# Patient Record
Sex: Male | Born: 1980
Health system: Southern US, Community
[De-identification: ages and names within clinical notes are randomized; demographics above are authoritative.]

## PROBLEM LIST (undated history)

## (undated) DIAGNOSIS — E538 Deficiency of other specified B group vitamins: Secondary | ICD-10-CM

## (undated) DIAGNOSIS — R17 Unspecified jaundice: Secondary | ICD-10-CM

## (undated) HISTORY — PX: OTHER SURGICAL HISTORY: SHX169

## (undated) HISTORY — PX: CONDYLOMA EXCISION/FULGURATION: SHX1389

## (undated) HISTORY — DX: Deficiency of other specified B group vitamins: E53.8

---

## 2010-01-04 DIAGNOSIS — F411 Generalized anxiety disorder: Secondary | ICD-10-CM | POA: Insufficient documentation

## 2010-08-19 LAB — HM HEPATITIS C SCREENING LAB: HM Hepatitis Screen: NEGATIVE

## 2012-10-06 HISTORY — PX: COLONOSCOPY: SHX174

## 2016-09-10 DIAGNOSIS — F4322 Adjustment disorder with anxiety: Secondary | ICD-10-CM | POA: Diagnosis not present

## 2016-09-17 DIAGNOSIS — F4322 Adjustment disorder with anxiety: Secondary | ICD-10-CM | POA: Diagnosis not present

## 2016-09-24 DIAGNOSIS — F4322 Adjustment disorder with anxiety: Secondary | ICD-10-CM | POA: Diagnosis not present

## 2016-10-27 DIAGNOSIS — F4322 Adjustment disorder with anxiety: Secondary | ICD-10-CM | POA: Diagnosis not present

## 2016-11-05 DIAGNOSIS — F4322 Adjustment disorder with anxiety: Secondary | ICD-10-CM | POA: Diagnosis not present

## 2016-12-03 DIAGNOSIS — F4322 Adjustment disorder with anxiety: Secondary | ICD-10-CM | POA: Diagnosis not present

## 2016-12-17 DIAGNOSIS — F4322 Adjustment disorder with anxiety: Secondary | ICD-10-CM | POA: Diagnosis not present

## 2016-12-31 DIAGNOSIS — F4322 Adjustment disorder with anxiety: Secondary | ICD-10-CM | POA: Diagnosis not present

## 2017-01-05 DIAGNOSIS — J0100 Acute maxillary sinusitis, unspecified: Secondary | ICD-10-CM | POA: Diagnosis not present

## 2017-01-21 DIAGNOSIS — F4322 Adjustment disorder with anxiety: Secondary | ICD-10-CM | POA: Diagnosis not present

## 2017-02-04 DIAGNOSIS — F4322 Adjustment disorder with anxiety: Secondary | ICD-10-CM | POA: Diagnosis not present

## 2017-02-18 DIAGNOSIS — F4322 Adjustment disorder with anxiety: Secondary | ICD-10-CM | POA: Diagnosis not present

## 2017-03-11 DIAGNOSIS — A770 Spotted fever due to Rickettsia rickettsii: Secondary | ICD-10-CM | POA: Diagnosis not present

## 2017-03-23 DIAGNOSIS — F4322 Adjustment disorder with anxiety: Secondary | ICD-10-CM | POA: Diagnosis not present

## 2017-04-14 DIAGNOSIS — F4322 Adjustment disorder with anxiety: Secondary | ICD-10-CM | POA: Diagnosis not present

## 2017-04-25 DIAGNOSIS — H52223 Regular astigmatism, bilateral: Secondary | ICD-10-CM | POA: Diagnosis not present

## 2017-04-25 DIAGNOSIS — H5203 Hypermetropia, bilateral: Secondary | ICD-10-CM | POA: Diagnosis not present

## 2017-04-29 DIAGNOSIS — S63512A Sprain of carpal joint of left wrist, initial encounter: Secondary | ICD-10-CM | POA: Diagnosis not present

## 2017-06-03 DIAGNOSIS — F4322 Adjustment disorder with anxiety: Secondary | ICD-10-CM | POA: Diagnosis not present

## 2017-07-08 DIAGNOSIS — F4322 Adjustment disorder with anxiety: Secondary | ICD-10-CM | POA: Diagnosis not present

## 2017-07-28 DIAGNOSIS — F4322 Adjustment disorder with anxiety: Secondary | ICD-10-CM | POA: Diagnosis not present

## 2017-08-12 DIAGNOSIS — F4322 Adjustment disorder with anxiety: Secondary | ICD-10-CM | POA: Diagnosis not present

## 2017-09-02 DIAGNOSIS — F4322 Adjustment disorder with anxiety: Secondary | ICD-10-CM | POA: Diagnosis not present

## 2017-09-09 DIAGNOSIS — F4322 Adjustment disorder with anxiety: Secondary | ICD-10-CM | POA: Diagnosis not present

## 2017-09-23 DIAGNOSIS — F4322 Adjustment disorder with anxiety: Secondary | ICD-10-CM | POA: Diagnosis not present

## 2017-10-21 DIAGNOSIS — F4322 Adjustment disorder with anxiety: Secondary | ICD-10-CM | POA: Diagnosis not present

## 2017-11-04 DIAGNOSIS — F4322 Adjustment disorder with anxiety: Secondary | ICD-10-CM | POA: Diagnosis not present

## 2017-11-26 DIAGNOSIS — R0789 Other chest pain: Secondary | ICD-10-CM | POA: Diagnosis not present

## 2017-11-26 DIAGNOSIS — R5383 Other fatigue: Secondary | ICD-10-CM | POA: Diagnosis not present

## 2017-11-26 DIAGNOSIS — F419 Anxiety disorder, unspecified: Secondary | ICD-10-CM | POA: Diagnosis not present

## 2017-11-26 DIAGNOSIS — F329 Major depressive disorder, single episode, unspecified: Secondary | ICD-10-CM | POA: Diagnosis not present

## 2017-11-26 DIAGNOSIS — R05 Cough: Secondary | ICD-10-CM | POA: Diagnosis not present

## 2017-11-26 DIAGNOSIS — R899 Unspecified abnormal finding in specimens from other organs, systems and tissues: Secondary | ICD-10-CM | POA: Diagnosis not present

## 2017-11-26 DIAGNOSIS — D510 Vitamin B12 deficiency anemia due to intrinsic factor deficiency: Secondary | ICD-10-CM | POA: Diagnosis not present

## 2017-11-30 DIAGNOSIS — R05 Cough: Secondary | ICD-10-CM | POA: Diagnosis not present

## 2017-11-30 DIAGNOSIS — R0789 Other chest pain: Secondary | ICD-10-CM | POA: Diagnosis not present

## 2017-12-02 DIAGNOSIS — F4322 Adjustment disorder with anxiety: Secondary | ICD-10-CM | POA: Diagnosis not present

## 2018-01-11 DIAGNOSIS — F4322 Adjustment disorder with anxiety: Secondary | ICD-10-CM | POA: Diagnosis not present

## 2018-02-03 DIAGNOSIS — F4322 Adjustment disorder with anxiety: Secondary | ICD-10-CM | POA: Diagnosis not present

## 2018-02-17 DIAGNOSIS — F4322 Adjustment disorder with anxiety: Secondary | ICD-10-CM | POA: Diagnosis not present

## 2018-03-03 DIAGNOSIS — F4322 Adjustment disorder with anxiety: Secondary | ICD-10-CM | POA: Diagnosis not present

## 2018-03-31 DIAGNOSIS — F4322 Adjustment disorder with anxiety: Secondary | ICD-10-CM | POA: Diagnosis not present

## 2018-04-14 DIAGNOSIS — F4322 Adjustment disorder with anxiety: Secondary | ICD-10-CM | POA: Diagnosis not present

## 2018-04-26 DIAGNOSIS — F4322 Adjustment disorder with anxiety: Secondary | ICD-10-CM | POA: Diagnosis not present

## 2018-05-17 DIAGNOSIS — F4322 Adjustment disorder with anxiety: Secondary | ICD-10-CM | POA: Diagnosis not present

## 2018-06-02 DIAGNOSIS — F4322 Adjustment disorder with anxiety: Secondary | ICD-10-CM | POA: Diagnosis not present

## 2018-06-15 DIAGNOSIS — F4322 Adjustment disorder with anxiety: Secondary | ICD-10-CM | POA: Diagnosis not present

## 2018-08-27 ENCOUNTER — Ambulatory Visit: Payer: BLUE CROSS/BLUE SHIELD | Admitting: Family Medicine

## 2018-08-27 ENCOUNTER — Encounter: Payer: Self-pay | Admitting: Family Medicine

## 2018-08-27 VITALS — BP 122/72 | HR 78 | Temp 98.4°F | Ht 75.0 in | Wt 202.6 lb

## 2018-08-27 DIAGNOSIS — F419 Anxiety disorder, unspecified: Secondary | ICD-10-CM | POA: Diagnosis not present

## 2018-08-27 DIAGNOSIS — L989 Disorder of the skin and subcutaneous tissue, unspecified: Secondary | ICD-10-CM | POA: Insufficient documentation

## 2018-08-27 DIAGNOSIS — N529 Male erectile dysfunction, unspecified: Secondary | ICD-10-CM | POA: Diagnosis not present

## 2018-08-27 DIAGNOSIS — R05 Cough: Secondary | ICD-10-CM

## 2018-08-27 DIAGNOSIS — F17200 Nicotine dependence, unspecified, uncomplicated: Secondary | ICD-10-CM | POA: Diagnosis not present

## 2018-08-27 DIAGNOSIS — R059 Cough, unspecified: Secondary | ICD-10-CM

## 2018-08-27 DIAGNOSIS — F17210 Nicotine dependence, cigarettes, uncomplicated: Secondary | ICD-10-CM | POA: Insufficient documentation

## 2018-08-27 MED ORDER — BENZONATATE 200 MG PO CAPS: 200.0000 mg | ORAL_CAPSULE | Freq: Two times a day (BID) | ORAL | 0 refills | Status: DC | PRN

## 2018-08-27 MED ORDER — ALPRAZOLAM 0.5 MG PO TABS: 0.5000 mg | ORAL_TABLET | Freq: Two times a day (BID) | ORAL | 0 refills | Status: DC | PRN

## 2018-08-27 MED ORDER — TADALAFIL 10 MG PO TABS: 10.0000 mg | ORAL_TABLET | ORAL | 0 refills | Status: DC

## 2018-08-27 MED ORDER — IPRATROPIUM BROMIDE 0.06 % NA SOLN: 2.0000 | Freq: Four times a day (QID) | NASAL | 0 refills | Status: DC

## 2018-08-27 MED ORDER — AZITHROMYCIN 250 MG PO TABS: ORAL_TABLET | ORAL | 0 refills | Status: DC

## 2018-08-27 NOTE — Assessment & Plan Note (Signed)
Discussed treatment options.  He uses benzos sparingly-maybe once or twice monthly.  His database was reviewed without red flags.  We will send a prescription for Xanax 0.5 mg twice daily as needed.  Discussed proper use of this medication and possible side effects.  He will follow-up with me in 6 months.  If begins to need more routinely, would consider starting SSRI vs psych referral. He will continue seeing his therapist.

## 2018-08-27 NOTE — Progress Notes (Signed)
Subjective:  Scott Watts is a 37 y.o. male who presents today with a chief complaint of cough and to establish care.   HPI:  Cough, acute problem Started 3-4 days ago. Worsening over that time. Worse in the morning. No fevers or chills.  Associated with postnasal drip and malaise.  No specific treatments tried.  No sick contacts.  No other obvious alleviating or aggravating factors.  Anxiety, chronic problem, new to provider Several year history.  Has been on several medications in the past including Prozac and Lexapro.  He currently sees a therapist, who has seen for the past 7 years.  His job is very stressful.  He occasionally takes Klonopin twice a month as needed.  He has tried Xanax in the past which seemed to have worked better.   Erectile Dysfunction Several year history. Currently on cilias 5mg  as needed. Does not think that this does not effective.  He initially had headache with a 5 mg dose, however this does not happen in quite a while.  Skin Lesion Started a few months ago.  Located on right arm.  Stable over that time.  Nicotine Dependence Several year history. Currently smokes about a half a pack per day.  He is looking into ways to stop smoking.  He has talked with his therapist about this.  ROS: Per HPI, otherwise a complete review of systems was negative.   PMH:  The following were reviewed and entered/updated in epic: Past Medical History:  Diagnosis Date  . Low serum vitamin B12    Patient Active Problem List   Diagnosis Date Noted  . Anxiety 08/27/2018  . Erectile dysfunction 08/27/2018  . Skin lesion 08/27/2018  . Nicotine dependence with current use 08/27/2018   History reviewed. No pertinent surgical history.  Family History  Problem Relation Age of Onset  . Cerebral aneurysm Mother   . Alcohol abuse Father   . Cancer Neg Hx     Medications- reviewed and updated Current Outpatient Medications  Medication Sig Dispense Refill  . tadalafil  (CIALIS) 10 MG tablet Take 1 tablet (10 mg total) by mouth once a week. 10 tablet 0  . ALPRAZolam (XANAX) 0.5 MG tablet Take 1 tablet (0.5 mg total) by mouth 2 (two) times daily as needed for anxiety. 30 tablet 0  . azithromycin (ZITHROMAX) 250 MG tablet Take 2 tabs day 1, then 1 tab daily 6 each 0  . benzonatate (TESSALON) 200 MG capsule Take 1 capsule (200 mg total) by mouth 2 (two) times daily as needed for cough. 20 capsule 0  . ipratropium (ATROVENT) 0.06 % nasal spray Place 2 sprays into both nostrils 4 (four) times daily. 15 mL 0   No current facility-administered medications for this visit.     Allergies-reviewed and updated No Known Allergies  Social History   Socioeconomic History  . Marital status: Single    Spouse name: Not on file  . Number of children: Not on file  . Years of education: Not on file  . Highest education level: Not on file  Occupational History  . Not on file  Social Needs  . Financial resource strain: Not on file  . Food insecurity:    Worry: Not on file    Inability: Not on file  . Transportation needs:    Medical: Not on file    Non-medical: Not on file  Tobacco Use  . Smoking status: Current Every Day Smoker  . Smokeless tobacco: Never Used  Substance and  Sexual Activity  . Alcohol use: Yes    Alcohol/week: 2.0 standard drinks    Types: 2 Cans of beer per week  . Drug use: Never  . Sexual activity: Yes  Lifestyle  . Physical activity:    Days per week: Not on file    Minutes per session: Not on file  . Stress: Not on file  Relationships  . Social connections:    Talks on phone: Not on file    Gets together: Not on file    Attends religious service: Not on file    Active member of club or organization: Not on file    Attends meetings of clubs or organizations: Not on file    Relationship status: Not on file  Other Topics Concern  . Not on file  Social History Narrative  . Not on file    Objective:  Physical Exam: BP 122/72  (BP Location: Left Arm, Patient Position: Sitting, Cuff Size: Normal)   Pulse 78   Temp 98.4 F (36.9 C) (Oral)   Ht 6\' 3"  (1.905 m)   Wt 202 lb 9.6 oz (91.9 kg)   SpO2 92%   BMI 25.32 kg/m   Gen: NAD, resting comfortably HEENT: TMs clear.  Oropharynx erythematous with no exudate.  No lymphadenopathy.  Nasal mucosa boggy and erythematous with white discharge. CV: RRR with no murmurs appreciated Pulm: NWOB, CTAB with no crackles, wheezes, or rhonchi GI: Normal bowel sounds present. Soft, Nontender, Nondistended. MSK: No edema, cyanosis, or clubbing noted Skin: Warm, dry.  Indurated area on the right forearm approximately 2 mm in diameter Neuro: Grossly normal, moves all extremities Psych: Normal affect and thought content  Assessment/Plan:  Anxiety Discussed treatment options.  He uses benzos sparingly-maybe once or twice monthly.  His database was reviewed without red flags.  We will send a prescription for Xanax 0.5 mg twice daily as needed.  Discussed proper use of this medication and possible side effects.  He will follow-up with me in 6 months.  If begins to need more routinely, would consider starting SSRI vs psych referral. He will continue seeing his therapist.   Erectile dysfunction Symptoms Cialis 10 mg daily as needed.  Discussed potential side effects.  Skin lesion Consistent with folliculitis. Continue with watchful waiting.   Nicotine dependence with current use Patient was asked about his tobacco use today and was strongly advised to quit. Patient is currently contemplative. We reviewed treatment options to assist him quit smoking including NRT, Chantix, and Bupropion. He will look into NRT. Follow up at next office visit.   Total time spent counseling approximately >3 minutes.    Cough Likely secondary to viral URI. No signs of bacterial infection. Start atrovent for rhinorrhea/sinus congestion.  Start Tessalon for cough. Sent in a "pocket prescription" for  azithromycin with strict instruction to not start unless symptoms worsen or fail to improve within the next several days. Recommended tylenol and/or motrin as needed for low grade fever and pain. Encouraged good oral hydration. Return precautions reviewed. Follow up as needed.    Katina Degreealeb M. Jimmey RalphParker, MD 08/27/2018 4:17 PM

## 2018-08-27 NOTE — Patient Instructions (Signed)
Start the atrovent.  Start tessalon for your cough.  Start the zpack if your symptoms worsen or do not improve in a few days.  Please stay well hydrated.  You can take tylenol and/or motrin as needed for low grade fever and pain.  Please let me know if your symptoms worsen or fail to improve.  I will send in cialis and xanax as well.  Please look into nicotine replacement.   Come back to see me in a few months for your physical, or sooner as needed.   Take care, Dr Jimmey RalphParker

## 2018-08-27 NOTE — Assessment & Plan Note (Signed)
Consistent with folliculitis. Continue with watchful waiting.

## 2018-08-27 NOTE — Assessment & Plan Note (Signed)
Symptoms Cialis 10 mg daily as needed.  Discussed potential side effects.

## 2018-08-27 NOTE — Assessment & Plan Note (Signed)
Patient was asked about his tobacco use today and was strongly advised to quit. Patient is currently contemplative. We reviewed treatment options to assist him quit smoking including NRT, Chantix, and Bupropion. He will look into NRT. Follow up at next office visit.   Total time spent counseling approximately >3 minutes.

## 2018-10-12 DIAGNOSIS — F4322 Adjustment disorder with anxiety: Secondary | ICD-10-CM | POA: Diagnosis not present

## 2018-10-26 ENCOUNTER — Ambulatory Visit (INDEPENDENT_AMBULATORY_CARE_PROVIDER_SITE_OTHER): Payer: BLUE CROSS/BLUE SHIELD

## 2018-10-26 ENCOUNTER — Ambulatory Visit: Payer: BLUE CROSS/BLUE SHIELD | Admitting: Family Medicine

## 2018-10-26 ENCOUNTER — Encounter: Payer: Self-pay | Admitting: Family Medicine

## 2018-10-26 VITALS — BP 106/66 | HR 72 | Temp 98.5°F | Ht 75.0 in | Wt 204.2 lb

## 2018-10-26 DIAGNOSIS — F419 Anxiety disorder, unspecified: Secondary | ICD-10-CM | POA: Diagnosis not present

## 2018-10-26 DIAGNOSIS — N529 Male erectile dysfunction, unspecified: Secondary | ICD-10-CM | POA: Diagnosis not present

## 2018-10-26 DIAGNOSIS — M5441 Lumbago with sciatica, right side: Secondary | ICD-10-CM | POA: Diagnosis not present

## 2018-10-26 DIAGNOSIS — S4991XA Unspecified injury of right shoulder and upper arm, initial encounter: Secondary | ICD-10-CM | POA: Diagnosis not present

## 2018-10-26 DIAGNOSIS — G8929 Other chronic pain: Secondary | ICD-10-CM

## 2018-10-26 DIAGNOSIS — M25511 Pain in right shoulder: Secondary | ICD-10-CM

## 2018-10-26 MED ORDER — CYCLOBENZAPRINE HCL 10 MG PO TABS: 10.0000 mg | ORAL_TABLET | Freq: Three times a day (TID) | ORAL | 0 refills | Status: DC | PRN

## 2018-10-26 MED ORDER — ALPRAZOLAM 0.5 MG PO TABS: 0.5000 mg | ORAL_TABLET | Freq: Two times a day (BID) | ORAL | 0 refills | Status: DC | PRN

## 2018-10-26 MED ORDER — DICLOFENAC SODIUM 75 MG PO TBEC: 75.0000 mg | DELAYED_RELEASE_TABLET | Freq: Two times a day (BID) | ORAL | 0 refills | Status: DC

## 2018-10-26 MED ORDER — TADALAFIL 10 MG PO TABS: 10.0000 mg | ORAL_TABLET | Freq: Every day | ORAL | 5 refills | Status: DC | PRN

## 2018-10-26 NOTE — Assessment & Plan Note (Signed)
Stable.  Continue Xanax 0.5 mg twice daily as needed. 

## 2018-10-26 NOTE — Assessment & Plan Note (Signed)
No red flags.  May have some component of piriformis syndrome.  Will have some improvement hopefully with diclofenac as noted above.  Also start Flexeril 10 mg 3 times daily as needed.  Discussed home exercise program and handout was given.  Recommended follow-up with sports medicine in the next few weeks if not improving.

## 2018-10-26 NOTE — Assessment & Plan Note (Signed)
Stable.  Cialis refilled. °

## 2018-10-26 NOTE — Patient Instructions (Signed)
It was very nice to see you today!  Please start the diclofenac and Flexeril.  Please work on exercises.  Please come back to see Dr. Berline Chough in 1 to 2 weeks.  Let me know if your symptoms worsen or do not improve over that time.  Take care, Dr [name]

## 2018-10-26 NOTE — Progress Notes (Signed)
   Subjective:  Scott Watts is a 38 y.o. male who presents today for same-day appointment with a chief complaint of shoulder pain.   HPI:  Shoulder Pain, Acute problem Started 2 weeks ago. Slipped while walking on ice and jolted his right arm forward and noticed immediate pain to the area.  He did not fall or have any trauma to the area.  Over the last couple weeks he has had persistent pain to his right shoulder.  He also has some associated decreased range of motion.  He has tried heating pad with modest improvement.  No oral medications tried.  Pain is worse with certain movements.  It is very difficult for him to reach above his head.  No other obvious alleviating or aggravating factors.   Back pain, chronic problem, new to provider Patient with several pain.  Located in right lower back and will radiate into his right leg.  He has seen a chiropractor in the past which did not significantly help.  He has hesitant to go back to a chiropractor.  Heating pads occasionally help.  Currently is getting massage therapy every couple weeks which seems to help.  No bowel or bladder incontinence.  No weakness or numbness.  His stable, chronic medical conditions are outlined below:  # ED - Currently taking sildenafil 10mg  weekly and tolerating well without side effects.  # Anxiety - Currently on xanax 0.5mg  twice daily as needed and tolerating well without side effects.  ROS: Per HPI  PMH: He reports that he has been smoking. He has never used smokeless tobacco. He reports current alcohol use of about 2.0 standard drinks of alcohol per week. He reports that he does not use drugs.  Objective:  Physical Exam: BP 106/66 (BP Location: Left Arm, Patient Position: Sitting, Cuff Size: Normal)   Pulse 72   Temp 98.5 F (36.9 C) (Oral)   Ht 6\' 3"  (1.905 m)   Wt 204 lb 4 oz (92.6 kg)   SpO2 93%   BMI 25.53 kg/m   Wt Readings from Last 3 Encounters:  10/26/18 204 lb 4 oz (92.6 kg)  08/27/18 202  lb 9.6 oz (91.9 kg)  Gen: NAD, resting comfortablyV: RRR with no murmurs appreciated Pulm: NWOB, CTAB with no crackles, wheezes, or rhonchi MSK: -Back: No deformities.  Mildly tender to palpation along lower lumbar paraspinal muscles and right superior gluteal muscle group.  Full range of motion throughout - Right shoulder: No deformities.  Limited range of motion with abduction and extension secondary to pain.  Rotator cuff strength testing intact throughout.  Tender to palpation throughout.  Neurovascular intact distally. -Lower extremities: No deformities.  Strength 5 out of 5 throughout.  Sensation light touch intact throughout.  Neurovascular intact distally.  Assessment/Plan:  Right shoulder pain Plain film without obvious abnormalities based on my read-we will await radiology read.  Symptoms seem to be consistent with soft tissue injury.  Given his lack of trauma, doubt ligamentous or bony injury.  We will treat conservatively with diclofenac 75 mg twice daily for the next 1 to 2 weeks.  Discussed home exercise program and handout was given.  Discussed reasons return to care.  Follow-up with sports medicine if not improving.    Katina Degree. [name], MD 10/26/2018 12:31 PM

## 2018-10-27 NOTE — Progress Notes (Signed)
Please inform patient of the following:  Radiologist reviewed his xray and did not see any other abnormalities. Would like for him to continue with the treatment plan we discussed at his visit and would like for him to let me know if not improving in the next 1-2 weeks.  Scott Watts. [name], MD 10/27/2018 8:17 AM

## 2018-12-23 ENCOUNTER — Other Ambulatory Visit: Payer: Self-pay

## 2018-12-23 ENCOUNTER — Encounter: Payer: Self-pay | Admitting: Family Medicine

## 2018-12-23 MED ORDER — ALPRAZOLAM 0.5 MG PO TABS: 0.5000 mg | ORAL_TABLET | Freq: Two times a day (BID) | ORAL | 0 refills | Status: DC | PRN

## 2018-12-23 MED ORDER — TADALAFIL 10 MG PO TABS: 10.0000 mg | ORAL_TABLET | Freq: Every day | ORAL | 5 refills | Status: DC | PRN

## 2018-12-23 NOTE — Progress Notes (Signed)
Please advise 

## 2019-01-12 DIAGNOSIS — F4322 Adjustment disorder with anxiety: Secondary | ICD-10-CM | POA: Diagnosis not present

## 2019-01-20 ENCOUNTER — Encounter: Payer: Self-pay | Admitting: Family Medicine

## 2019-01-24 ENCOUNTER — Ambulatory Visit (INDEPENDENT_AMBULATORY_CARE_PROVIDER_SITE_OTHER): Payer: BLUE CROSS/BLUE SHIELD | Admitting: Family Medicine

## 2019-01-24 ENCOUNTER — Encounter: Payer: Self-pay | Admitting: Family Medicine

## 2019-01-24 DIAGNOSIS — F419 Anxiety disorder, unspecified: Secondary | ICD-10-CM | POA: Diagnosis not present

## 2019-01-24 DIAGNOSIS — G4700 Insomnia, unspecified: Secondary | ICD-10-CM | POA: Insufficient documentation

## 2019-01-24 MED ORDER — HYDROXYZINE HCL 50 MG PO TABS: 25.0000 mg | ORAL_TABLET | Freq: Every evening | ORAL | 1 refills | Status: DC | PRN

## 2019-01-24 NOTE — Assessment & Plan Note (Signed)
Chronic problem. New to provider. Discussed treatment options.  Discussed sleep hygiene.  Will start hydroxyzine 50 mg daily.  Can decrease to 25 mg or increase 100 mg as needed.  Follow-up with me in a few weeks if not improving.  He will also follow-up with the therapist soon.  May have a component of anxiety plantar all.

## 2019-01-24 NOTE — Progress Notes (Signed)
    Chief Complaint:  Scott Watts is a 38 y.o. male who presents today for a virtual office visit with a chief complaint of insomnia.   Assessment/Plan:  Insomnia Chronic problem. New to provider. Discussed treatment options.  Discussed sleep hygiene.  Will start hydroxyzine 50 mg daily.  Can decrease to 25 mg or increase 100 mg as needed.  Follow-up with me in a few weeks if not improving.  He will also follow-up with the therapist soon.  May have a component of anxiety plantar all.  Anxiety Chronic problem.  Stable.  Continue Xanax 0.5 mg 2 times daily as needed.  He will follow-up with his therapist soon.     Subjective:  HPI:  Insomnia  Several year history. Worsened over the past few weeks. He has tried taking his xanax which has not helped. No recent changes to his routine.  No caffeine intake.  Does not use screens in bed.  Anxiety may be a little worse.  No other obvious alleviating or aggravating factors. Has tried trazodone in the past which did not work. N  # Anxiety - On xanax 0.5mg  twice daily as needed and tolerating well  ROS: Per HPI  PMH: He reports that he has been smoking. He has never used smokeless tobacco. He reports current alcohol use of about 2.0 standard drinks of alcohol per week. He reports that he does not use drugs.      Objective/Observations  Physical Exam: Gen: NAD, resting comfortably Pulm: Normal work of breathing Neuro: Grossly normal, moves all extremities Psych: Normal affect and thought content  Virtual Visit via Video   I connected with Kourtney Hamberg on 01/24/19 at 11:00 AM EDT by a video enabled telemedicine application and verified that I am speaking with the correct person using two identifiers. I discussed the limitations of evaluation and management by telemedicine and the availability of in person appointments. The patient expressed understanding and agreed to proceed.   Patient location: Home Provider location: [place] [facility]  [facility] [employer] Persons participating in the virtual visit: Myself and patient     Katina Degree. [name], MD 01/24/2019 12:52 PM

## 2019-01-24 NOTE — Assessment & Plan Note (Signed)
Chronic problem.  Stable.  Continue Xanax 0.5 mg 2 times daily as needed.  He will follow-up with his therapist soon.

## 2019-02-18 ENCOUNTER — Encounter: Payer: Self-pay | Admitting: Family Medicine

## 2019-03-01 ENCOUNTER — Ambulatory Visit: Payer: BLUE CROSS/BLUE SHIELD | Admitting: Family Medicine

## 2019-03-01 ENCOUNTER — Encounter: Payer: Self-pay | Admitting: Family Medicine

## 2019-03-01 ENCOUNTER — Other Ambulatory Visit: Payer: Self-pay

## 2019-03-01 VITALS — BP 116/68 | HR 80 | Temp 98.3°F | Ht 75.0 in | Wt 205.6 lb

## 2019-03-01 DIAGNOSIS — L649 Androgenic alopecia, unspecified: Secondary | ICD-10-CM

## 2019-03-01 DIAGNOSIS — L821 Other seborrheic keratosis: Secondary | ICD-10-CM

## 2019-03-01 DIAGNOSIS — S63631D Sprain of interphalangeal joint of left index finger, subsequent encounter: Secondary | ICD-10-CM

## 2019-03-01 DIAGNOSIS — L820 Inflamed seborrheic keratosis: Secondary | ICD-10-CM | POA: Diagnosis not present

## 2019-03-01 DIAGNOSIS — A630 Anogenital (venereal) warts: Secondary | ICD-10-CM | POA: Insufficient documentation

## 2019-03-01 DIAGNOSIS — G4700 Insomnia, unspecified: Secondary | ICD-10-CM | POA: Diagnosis not present

## 2019-03-01 DIAGNOSIS — F419 Anxiety disorder, unspecified: Secondary | ICD-10-CM

## 2019-03-01 DIAGNOSIS — N529 Male erectile dysfunction, unspecified: Secondary | ICD-10-CM

## 2019-03-01 MED ORDER — ALPRAZOLAM 0.5 MG PO TABS: 0.5000 mg | ORAL_TABLET | Freq: Two times a day (BID) | ORAL | 5 refills | Status: DC | PRN

## 2019-03-01 MED ORDER — FINASTERIDE 1 MG PO TABS: 1.0000 mg | ORAL_TABLET | Freq: Every day | ORAL | 5 refills | Status: DC

## 2019-03-01 MED ORDER — VORTIOXETINE HBR 10 MG PO TABS: 10.0000 mg | ORAL_TABLET | Freq: Every day | ORAL | 5 refills | Status: DC

## 2019-03-01 NOTE — Assessment & Plan Note (Signed)
Advised him to follow-up with dermatology for further evaluation/management.

## 2019-03-01 NOTE — Assessment & Plan Note (Signed)
Stable.  Tolerating tadalafil well.  Will refill.

## 2019-03-01 NOTE — Assessment & Plan Note (Addendum)
Discussed treatment options.  Will start finasteride 1 mg daily.  Discussed potential side effects including sexual dysfunction and breast development.  He will also use Rogaine.

## 2019-03-01 NOTE — Assessment & Plan Note (Signed)
Slightly worsened.  Discussed treatment options.  Does not want to start SSRI or MRI due to potential sexual side effects.  Did not tolerate Wellbutrin in the past.  Will start Trintellix.  Continue current dose of Xanax.  If not able to tolerate treatment also if not able to afford, would consider trial of BuSpar.

## 2019-03-01 NOTE — Patient Instructions (Signed)
It was very nice to see you today!  The spot on your side is a seborrheic keratosis.  This is a benign lesion.  We froze this today.  Please buddy tape your fingers.  We will start finasteride.  Please let me know if any side effects.  We will also start Trintellix.      Take care, Dr [name]

## 2019-03-01 NOTE — Progress Notes (Signed)
Chief Complaint:  Scott Watts is a 38 y.o. male who presents today with a chief complaint of skin lesion.   Assessment/Plan:  Seborrheic keratosis Irritated.  Cryotherapy applied today.  See below procedure note.  Patient tolerated well.  Finger sprain No red flags.  Will buddy tape fingers.  Discussed home exercise program and handout was given.  Continue icing anti-inflammatories as needed.  Insomnia Stable.  Continue hydroxyzine 25 to 100 mg daily as needed.  Erectile dysfunction Stable.  Tolerating tadalafil well.  Will refill.  Condyloma Advised him to follow-up with dermatology for further evaluation/management.  Anxiety Slightly worsened.  Discussed treatment options.  Does not want to start SSRI or MRI due to potential sexual side effects.  Did not tolerate Wellbutrin in the past.  Will start Trintellix.  Continue current dose of Xanax.  If not able to tolerate treatment also if not able to afford, would consider trial of BuSpar.  Androgenic alopecia Discussed treatment options.  Will start finasteride 1 mg daily.  Discussed potential side effects including sexual dysfunction and breast development.  He will also use Rogaine.     Subjective:  HPI:  Skin Lesion Located on left trunk.  Somewhat irritated.  Feels waxy.  No specific treatments tried.  No obvious exacerbating factors.  Alopecia Several year history.  Has tried minoxidil in the past.  Has been prescribed finasteride in the past but never strated it. He is interested in starting.  Finger pain Patient suffered a fall about 4 months ago.  Injured his right shoulder.  Also injured his left index finger.  Pain is improved however still has occasional pain in his second DIP joint on that hand.  He has had some associated swelling.  Some redness.  No numbness.  Fornage motion.  His stable, chronic medical conditions are outlined below:  # ED - Currently taking sildenafil 10mg  weekly and tolerating well  without side effects.  # Anxiety - Currently on xanax 0.5mg  twice daily as needed and tolerating well without side effects. - Symptoms have worsened recently due to COVID19. - Is interested in trying another medication    ROS: Per HPI  PMH: He reports that he has been smoking. He has never used smokeless tobacco. He reports current alcohol use of about 2.0 standard drinks of alcohol per week. He reports that he does not use drugs.      Objective:  Physical Exam: BP 116/68 (BP Location: Left Arm, Patient Position: Sitting, Cuff Size: Normal)   Pulse 80   Temp 98.3 F (36.8 C) (Oral)   Ht 6\' 3"  (1.905 m)   Wt 205 lb 9.6 oz (93.3 kg)   SpO2 91%   BMI 25.70 kg/m   Wt Readings from Last 3 Encounters:  03/01/19 205 lb 9.6 oz (93.3 kg)  10/26/18 204 lb 4 oz (92.6 kg)  08/27/18 202 lb 9.6 oz (91.9 kg)   Gen: NAD, resting comfortably CV: Regular rate and rhythm with no murmurs appreciated Pulm: Normal work of breathing, clear to auscultation bilaterally with no crackles, wheezes, or rhonchi MSK: No edema, cyanosis, or clubbing noted.  Left index finger with no deformities.  Tender location along PIP.  Full range motion throughout. Skin: Warm, dry.  Approximately 5 mm hyperpigmented lesion with waxy appearance on left flank.  Mild amount of surrounding erythema. Neuro: Grossly normal, moves all extremities Psych: Normal affect and thought content  Cryotherapy Procedure Note  Pre-operative Diagnosis: Seborrheic keratosis  Locations: Left flank  Indications: Therapeutic  Procedure Details  Patient informed of risks (permanent scarring, infection, light or dark discoloration, bleeding, infection, weakness, numbness and recurrence of the lesion) and benefits of the procedure and verbal informed consent obtained.  The areas are treated with liquid nitrogen therapy, frozen until ice ball extended 3 mm beyond lesion, allowed to thaw, and treated again. The patient tolerated procedure  well.  The patient was instructed on post-op care, warned that there may be blister formation, redness and pain. Recommend OTC analgesia as needed for pain.  Condition: Stable  Complications: none.        Katina Degreealeb M. Jimmey RalphParker, MD 03/01/2019 4:00 PM

## 2019-03-01 NOTE — Assessment & Plan Note (Signed)
Stable.  Continue hydroxyzine 25 to 100 mg daily as needed.

## 2019-03-11 ENCOUNTER — Encounter: Payer: Self-pay | Admitting: Family Medicine

## 2019-03-14 MED ORDER — TADALAFIL 10 MG PO TABS: 10.0000 mg | ORAL_TABLET | Freq: Every day | ORAL | 5 refills | Status: DC | PRN

## 2019-03-14 MED ORDER — ALPRAZOLAM 0.5 MG PO TABS: 0.5000 mg | ORAL_TABLET | Freq: Two times a day (BID) | ORAL | 5 refills | Status: DC | PRN

## 2019-03-14 NOTE — Telephone Encounter (Signed)
Pharmacies have been updated. Forwarding to Dr. Jerline Pain.

## 2019-03-23 DIAGNOSIS — F4322 Adjustment disorder with anxiety: Secondary | ICD-10-CM | POA: Diagnosis not present

## 2019-06-01 ENCOUNTER — Encounter: Payer: Self-pay | Admitting: Family Medicine

## 2019-06-06 ENCOUNTER — Ambulatory Visit (INDEPENDENT_AMBULATORY_CARE_PROVIDER_SITE_OTHER): Payer: BC Managed Care – PPO | Admitting: Family Medicine

## 2019-06-06 ENCOUNTER — Other Ambulatory Visit: Payer: Self-pay

## 2019-06-06 ENCOUNTER — Encounter: Payer: Self-pay | Admitting: Family Medicine

## 2019-06-06 VITALS — BP 124/72 | HR 77 | Temp 98.4°F | Ht 75.0 in | Wt 206.0 lb

## 2019-06-06 DIAGNOSIS — F419 Anxiety disorder, unspecified: Secondary | ICD-10-CM | POA: Diagnosis not present

## 2019-06-06 DIAGNOSIS — E663 Overweight: Secondary | ICD-10-CM

## 2019-06-06 DIAGNOSIS — L989 Disorder of the skin and subcutaneous tissue, unspecified: Secondary | ICD-10-CM

## 2019-06-06 DIAGNOSIS — L720 Epidermal cyst: Secondary | ICD-10-CM | POA: Diagnosis not present

## 2019-06-06 DIAGNOSIS — F17210 Nicotine dependence, cigarettes, uncomplicated: Secondary | ICD-10-CM

## 2019-06-06 DIAGNOSIS — G4700 Insomnia, unspecified: Secondary | ICD-10-CM

## 2019-06-06 DIAGNOSIS — Z6825 Body mass index (BMI) 25.0-25.9, adult: Secondary | ICD-10-CM

## 2019-06-06 NOTE — Patient Instructions (Signed)
It was very nice to see you today!  I think you have a couple of benign cysts. These are not a sign of anything serious and should nto be an issue going forward.   Call 1-800-QUIT-NOW for some resources to help with stopping smoking  Take care, Dr Jerline Pain  Please try these tips to maintain a healthy lifestyle:   Eat at least 3 REAL meals and 1-2 snacks per day.  Aim for no more than 5 hours between eating.  If you eat breakfast, please do so within one hour of getting up.    Obtain twice as many fruits/vegetables as protein or carbohydrate foods for both lunch and dinner. (Half of each meal should be fruits/vegetables, one quarter protein, and one quarter starchy carbs)   Cut down on sweet beverages. This includes juice, soda, and sweet tea.    Exercise at least 150 minutes every week.

## 2019-06-06 NOTE — Assessment & Plan Note (Signed)
Continue Xanax 0.5 mg twice daily as needed.  Continue trintellix 10mg  daily.

## 2019-06-06 NOTE — Assessment & Plan Note (Signed)
Patient was asked about his tobacco use today and was strongly advised to quit. Patient is currently contemplative. We reviewed treatment options to assist him quit smoking including NRT, Chantix, and Bupropion.  He will come to try nicotine replacement.  Follow up at next office visit.   Total time spent counseling approximately 5 minutes.

## 2019-06-06 NOTE — Progress Notes (Signed)
   Chief Complaint:  Scott Watts is a 38 y.o. male who presents for same day appointment with a chief complaint of skin lesion.   Assessment/Plan:  Skin Lesion Appearance not consistent with condyloma.  Possibly inclusion cyst versus ingrown hair.  We will continue with watchful waiting.  Discussed reasons return to care.  Insomnia Stable.  Continue hydroxyzine as needed.  Cigarette smoker Patient was asked about his tobacco use today and was strongly advised to quit. Patient is currently contemplative. We reviewed treatment options to assist him quit smoking including NRT, Chantix, and Bupropion.  He will come to try nicotine replacement.  Follow up at next office visit.   Total time spent counseling approximately 5 minutes.    Anxiety Continue Xanax 0.5 mg twice daily as needed.  Continue trintellix 10mg  daily.   Body mass index is 25.75 kg/m. / Overweight BMI Metric Follow Up - 06/06/19 1215      BMI Metric Follow Up-Please document annually   BMI Metric Follow Up  Education provided          Subjective:  HPI:  Skin Lesion, acute problem Located on penis.  It has been present for a few weeks.  Not changing in size.  No pain.  Has a history of condyloma in the past and is worried he is having a recurrence.  No treatments tried.  No other obvious alleviating or aggravating factors.  His stable, chronic medical conditions are outlined below:  # Anxiety / Insomnia - Currently on xanax 0.5mg  twice daily as needed and tolerating well without side effects. - Uses hydroxyzine as needed - Sees a therapist  # Cigarette Smoker - Working on cutting down   ROS: Per HPI  PMH: He reports that he has been smoking. He has never used smokeless tobacco. He reports current alcohol use of about 2.0 standard drinks of alcohol per week. He reports that he does not use drugs.      Objective:  Physical Exam: BP 124/72 (BP Location: Left Arm, Patient Position: Sitting, Cuff Size:  Normal)   Pulse 77   Temp 98.4 F (36.9 C)   Ht 6\' 3"  (1.905 m)   Wt 206 lb (93.4 kg)   SpO2 94%   BMI 25.75 kg/m   Gen: NAD, resting comfortably CV: Regular rate and rhythm with no murmurs appreciated Pulm: Normal work of breathing, clear to auscultation bilaterally with no crackles, wheezes, or rhonchi GU: Very fine papules noted on right side of penile shaft.  No keratosis noted.  Anus without obvious abnormalities. MSK: No edema, cyanosis, or clubbing noted Skin: Warm, dry Neuro: Grossly normal, moves all extremities Psych: Normal affect and thought content  No results found for this or any previous visit (from the past 24 hour(s)).      Algis Greenhouse. Jerline Pain, MD 06/06/2019 12:00 PM

## 2019-06-06 NOTE — Assessment & Plan Note (Signed)
Stable. Continue hydroxyzine as needed.  

## 2019-06-08 DIAGNOSIS — Z20828 Contact with and (suspected) exposure to other viral communicable diseases: Secondary | ICD-10-CM | POA: Diagnosis not present

## 2019-06-22 DIAGNOSIS — F4322 Adjustment disorder with anxiety: Secondary | ICD-10-CM | POA: Diagnosis not present

## 2019-06-30 DIAGNOSIS — F4322 Adjustment disorder with anxiety: Secondary | ICD-10-CM | POA: Diagnosis not present

## 2019-07-07 DIAGNOSIS — F4322 Adjustment disorder with anxiety: Secondary | ICD-10-CM | POA: Diagnosis not present

## 2019-07-19 DIAGNOSIS — F4322 Adjustment disorder with anxiety: Secondary | ICD-10-CM | POA: Diagnosis not present

## 2019-07-25 ENCOUNTER — Encounter: Payer: Self-pay | Admitting: Family Medicine

## 2019-08-01 ENCOUNTER — Other Ambulatory Visit: Payer: Self-pay

## 2019-08-01 ENCOUNTER — Ambulatory Visit: Payer: BC Managed Care – PPO | Admitting: Family Medicine

## 2019-08-01 ENCOUNTER — Encounter: Payer: Self-pay | Admitting: Family Medicine

## 2019-08-01 VITALS — BP 114/68 | HR 63 | Temp 98.3°F | Ht 75.0 in | Wt 205.2 lb

## 2019-08-01 DIAGNOSIS — M5441 Lumbago with sciatica, right side: Secondary | ICD-10-CM | POA: Diagnosis not present

## 2019-08-01 DIAGNOSIS — G4700 Insomnia, unspecified: Secondary | ICD-10-CM | POA: Diagnosis not present

## 2019-08-01 DIAGNOSIS — F419 Anxiety disorder, unspecified: Secondary | ICD-10-CM | POA: Diagnosis not present

## 2019-08-01 DIAGNOSIS — L989 Disorder of the skin and subcutaneous tissue, unspecified: Secondary | ICD-10-CM | POA: Diagnosis not present

## 2019-08-01 DIAGNOSIS — G8929 Other chronic pain: Secondary | ICD-10-CM

## 2019-08-01 MED ORDER — MUPIROCIN 2 % EX OINT: 1.0000 "application " | TOPICAL_OINTMENT | Freq: Two times a day (BID) | CUTANEOUS | 0 refills | Status: DC

## 2019-08-01 NOTE — Assessment & Plan Note (Signed)
Patient would like to follow-up with sports medicine.  He will contact me when he would like to be referred.  Symptoms are currently stable.

## 2019-08-01 NOTE — Patient Instructions (Signed)
It was very nice to see you today!  Please use the ointment twice daily for the next 5 to 7 days.  I will also place a referral for you to see the dermatologist.  Take care, Dr Jerline Pain  Please try these tips to maintain a healthy lifestyle:   Eat at least 3 REAL meals and 1-2 snacks per day.  Aim for no more than 5 hours between eating.  If you eat breakfast, please do so within one hour of getting up.    Obtain twice as many fruits/vegetables as protein or carbohydrate foods for both lunch and dinner. (Half of each meal should be fruits/vegetables, one quarter protein, and one quarter starchy carbs)   Cut down on sweet beverages. This includes juice, soda, and sweet tea.    Exercise at least 150 minutes every week.

## 2019-08-01 NOTE — Assessment & Plan Note (Signed)
Stable. Continue hydroxyzine as needed.  

## 2019-08-01 NOTE — Assessment & Plan Note (Signed)
Continue Trintellix 10 mg daily and Xanax 0.5 mg twice daily as needed.

## 2019-08-01 NOTE — Progress Notes (Signed)
   Chief Complaint:  Scott Watts is a 38 y.o. male who presents today with a chief complaint of skin lesion.   Assessment/Plan:  Skin lesion Likely small epidermal cyst.  Has secondary infection currently.  Will start topical Bactroban.  Will place referral to dermatology as well.    Insomnia Stable.  Continue hydroxyzine as needed.  Chronic right-sided low back pain with right-sided sciatica Patient would like to follow-up with sports medicine.  He will contact me when he would like to be referred.  Symptoms are currently stable.  Anxiety Continue Trintellix 10 mg daily and Xanax 0.5 mg twice daily as needed.     Subjective:  HPI:  Skin Lesion Located on right penile shaft.  It has been there for several months.  Recently has grown larger.  He tried to "pop it".  He was able to express a small amount of fluid out of it.  Since then has became more red.  Has had some purulent drainage as well.  Some pain.  Tried Neosporin with no improvement.  Also tried imiquimod with no improvement.  No fever chills.  His stable, chronic medical conditions are outlined below:  # ED - Currently taking sildenafil 10mg  weekly and tolerating well without side effects.  # Anxiety / Insomnia - Currently on xanax 0.5mg  twice daily as needed and tolerating well without side effects. - Uses hydroxyzine as needed - Sees a therapist  % Chronic Low Back Pain - Referred to sports medicine - Started diclofenac 75mg  bid and flexeril 10mg  tid prn  ROS: Per HPI  PMH: He reports that he has been smoking. He has never used smokeless tobacco. He reports current alcohol use of about 2.0 standard drinks of alcohol per week. He reports that he does not use drugs.      Objective:  Physical Exam: BP 114/68   Pulse 63   Temp 98.3 F (36.8 C)   Ht 6\' 3"  (1.905 m)   Wt 205 lb 4 oz (93.1 kg)   SpO2 96%   BMI 25.65 kg/m   Gen: NAD, resting comfortably CV: Regular rate and rhythm with no murmurs  appreciated Pulm: Normal work of breathing, clear to auscultation bilaterally with no crackles, wheezes, or rhonchi Skin: Right penile shaft with 2 discrete lesions.  1 lesion appears to be cystic in nature.  Other lesion is a 1 to 2 mm pustule.     Algis Greenhouse. Jerline Pain, MD 08/01/2019 2:18 PM

## 2019-08-02 ENCOUNTER — Telehealth: Payer: Self-pay | Admitting: Physical Therapy

## 2019-08-02 NOTE — Telephone Encounter (Signed)
Left detailed voice message,patient needs to call his insurance to see who is in network and give [place] a call where he wants [place] to send a referral.

## 2019-08-02 NOTE — Telephone Encounter (Signed)
Copied from [place] [phone]. Topic: Referral - Status >> Aug 02, 2019 10:34 AM Erick Blinks wrote: Reason for CRM: Pt called requesting that his dermatology referral be sent to local [place] offices where he lives if possible, also a facility within his network

## 2019-08-03 DIAGNOSIS — F4322 Adjustment disorder with anxiety: Secondary | ICD-10-CM | POA: Diagnosis not present

## 2019-08-16 ENCOUNTER — Encounter: Payer: Self-pay | Admitting: Family Medicine

## 2019-08-18 ENCOUNTER — Ambulatory Visit (INDEPENDENT_AMBULATORY_CARE_PROVIDER_SITE_OTHER): Payer: BC Managed Care – PPO | Admitting: Family Medicine

## 2019-08-18 ENCOUNTER — Encounter: Payer: Self-pay | Admitting: Family Medicine

## 2019-08-18 DIAGNOSIS — R197 Diarrhea, unspecified: Secondary | ICD-10-CM | POA: Diagnosis not present

## 2019-08-18 DIAGNOSIS — G4700 Insomnia, unspecified: Secondary | ICD-10-CM

## 2019-08-18 DIAGNOSIS — F419 Anxiety disorder, unspecified: Secondary | ICD-10-CM | POA: Diagnosis not present

## 2019-08-18 MED ORDER — CLONAZEPAM 0.5 MG PO TABS: 0.2500 mg | ORAL_TABLET | Freq: Two times a day (BID) | ORAL | 1 refills | Status: DC | PRN

## 2019-08-18 NOTE — Assessment & Plan Note (Signed)
Slightly worsened.  Has had significant life stressors recently.  He is working on those and has made some significant improvements.  We will switch from Xanax to Klonopin.  We will continue Trintellix 10 mg daily.  He will continue seeing his therapist.

## 2019-08-18 NOTE — Assessment & Plan Note (Signed)
Stable.  We will continue hydroxyzine 25 mg nightly.  We will switch from Xanax to Klonopin 0.25 to 0.5 mg twice daily as needed.  Follow-up with me in 6 months.

## 2019-08-18 NOTE — Progress Notes (Signed)
    Chief Complaint:  Scott Watts is a 38 y.o. male who presents today for a virtual office visit with a chief complaint of insomnia.   Assessment/Plan:  No problem-specific Assessment & Plan notes found for this encounter.  Diarrhea No red flags.  Possibly stress-induced.  He is making improvements to manage his stress including removing negative influences and stressful relationships from his life.  He is also planning on working out more.  He is increasing his fiber intake.  Recommended that he also try taking Imodium as needed.  If not improving in the next couple of weeks would consider blood work and/or stool studies.    Subjective:  HPI:  Insomnia Currently taking hydroxyzine 25mg  nightly. Occasionally takes xanax 0.5mg  twice daily as needed which helps with his sleep.  He has been on Klonopin in the past.  He tried taking Klonopin with his hydroxyzine recently and noted that this worked much better than the Xanax.  He would like a refill on Klonopin today.  Does not have any next-day grogginess.  He has been having some diarrhea over the last few weeks 2.  He attributes this to mostly increased life stress.  States that he has had somebody, to his wife who is remind him of past traumatic experiences.  He has tried to work on stress reduction techniques.  He is also planning on increasing his fiber.  No reported abdominal pain.  No reported nausea or vomiting.  ROS: Per HPI  PMH: He reports that he has been smoking. He has never used smokeless tobacco. He reports current alcohol use of about 2.0 standard drinks of alcohol per week. He reports that he does not use drugs.      Objective/Observations  Physical Exam: Gen: NAD, resting comfortably Pulm: Normal work of breathing Neuro: Grossly normal, moves all extremities Psych: Normal affect and thought content  Virtual Visit via Video   I connected with Scott Watts on 08/18/19 at  3:40 PM EST by a video enabled telemedicine  application and verified that I am speaking with the correct person using two identifiers. I discussed the limitations of evaluation and management by telemedicine and the availability of in person appointments. The patient expressed understanding and agreed to proceed.   Patient location: Home Provider location: [place] participating in the virtual visit: Myself and Patient     Algis Greenhouse. Jerline Pain, MD 08/18/2019 3:26 PM

## 2019-08-24 DIAGNOSIS — F4322 Adjustment disorder with anxiety: Secondary | ICD-10-CM | POA: Diagnosis not present

## 2019-09-06 DIAGNOSIS — F4322 Adjustment disorder with anxiety: Secondary | ICD-10-CM | POA: Diagnosis not present

## 2019-09-13 DIAGNOSIS — Z23 Encounter for immunization: Secondary | ICD-10-CM | POA: Diagnosis not present

## 2019-09-13 DIAGNOSIS — L700 Acne vulgaris: Secondary | ICD-10-CM | POA: Diagnosis not present

## 2019-09-13 DIAGNOSIS — L988 Other specified disorders of the skin and subcutaneous tissue: Secondary | ICD-10-CM | POA: Diagnosis not present

## 2019-09-13 DIAGNOSIS — L739 Follicular disorder, unspecified: Secondary | ICD-10-CM | POA: Diagnosis not present

## 2019-09-13 DIAGNOSIS — Z411 Encounter for cosmetic surgery: Secondary | ICD-10-CM | POA: Diagnosis not present

## 2019-09-20 DIAGNOSIS — F4322 Adjustment disorder with anxiety: Secondary | ICD-10-CM | POA: Diagnosis not present

## 2019-10-18 DIAGNOSIS — Z20828 Contact with and (suspected) exposure to other viral communicable diseases: Secondary | ICD-10-CM | POA: Diagnosis not present

## 2019-10-18 DIAGNOSIS — Z03818 Encounter for observation for suspected exposure to other biological agents ruled out: Secondary | ICD-10-CM | POA: Diagnosis not present

## 2019-10-25 DIAGNOSIS — F4322 Adjustment disorder with anxiety: Secondary | ICD-10-CM | POA: Diagnosis not present

## 2019-10-25 DIAGNOSIS — F419 Anxiety disorder, unspecified: Secondary | ICD-10-CM | POA: Diagnosis not present

## 2019-10-25 DIAGNOSIS — F17210 Nicotine dependence, cigarettes, uncomplicated: Secondary | ICD-10-CM | POA: Diagnosis not present

## 2019-10-25 DIAGNOSIS — Z0001 Encounter for general adult medical examination with abnormal findings: Secondary | ICD-10-CM | POA: Diagnosis not present

## 2019-10-25 DIAGNOSIS — Z13220 Encounter for screening for lipoid disorders: Secondary | ICD-10-CM | POA: Diagnosis not present

## 2019-10-25 DIAGNOSIS — G4700 Insomnia, unspecified: Secondary | ICD-10-CM | POA: Diagnosis not present

## 2019-10-25 DIAGNOSIS — K649 Unspecified hemorrhoids: Secondary | ICD-10-CM | POA: Diagnosis not present

## 2019-11-08 DIAGNOSIS — F4322 Adjustment disorder with anxiety: Secondary | ICD-10-CM | POA: Diagnosis not present

## 2019-11-11 ENCOUNTER — Other Ambulatory Visit: Payer: Self-pay

## 2019-11-11 ENCOUNTER — Ambulatory Visit (INDEPENDENT_AMBULATORY_CARE_PROVIDER_SITE_OTHER): Payer: BC Managed Care – PPO | Admitting: Family Medicine

## 2019-11-11 ENCOUNTER — Encounter: Payer: Self-pay | Admitting: Family Medicine

## 2019-11-11 VITALS — BP 110/60 | HR 72 | Temp 97.2°F | Ht 75.0 in | Wt 211.4 lb

## 2019-11-11 DIAGNOSIS — L649 Androgenic alopecia, unspecified: Secondary | ICD-10-CM

## 2019-11-11 DIAGNOSIS — F419 Anxiety disorder, unspecified: Secondary | ICD-10-CM

## 2019-11-11 DIAGNOSIS — Z0001 Encounter for general adult medical examination with abnormal findings: Secondary | ICD-10-CM | POA: Diagnosis not present

## 2019-11-11 DIAGNOSIS — G4700 Insomnia, unspecified: Secondary | ICD-10-CM

## 2019-11-11 DIAGNOSIS — F17210 Nicotine dependence, cigarettes, uncomplicated: Secondary | ICD-10-CM | POA: Diagnosis not present

## 2019-11-11 DIAGNOSIS — Z13220 Encounter for screening for lipoid disorders: Secondary | ICD-10-CM

## 2019-11-11 DIAGNOSIS — K649 Unspecified hemorrhoids: Secondary | ICD-10-CM | POA: Diagnosis not present

## 2019-11-11 DIAGNOSIS — N529 Male erectile dysfunction, unspecified: Secondary | ICD-10-CM

## 2019-11-11 MED ORDER — NICOTINE 10 MG IN INHA: 1.0000 | RESPIRATORY_TRACT | 0 refills | Status: DC | PRN

## 2019-11-11 MED ORDER — FINASTERIDE-MINOXIDIL 0.1-7 % EX SOLN: 1.0000 "application " | Freq: Every day | CUTANEOUS | 5 refills | Status: DC

## 2019-11-11 MED ORDER — VENLAFAXINE HCL ER 37.5 MG PO CP24: 37.5000 mg | ORAL_CAPSULE | Freq: Every day | ORAL | 5 refills | Status: DC

## 2019-11-11 MED ORDER — TADALAFIL 20 MG PO TABS: 10.0000 mg | ORAL_TABLET | Freq: Every day | ORAL | 5 refills | Status: DC | PRN

## 2019-11-11 NOTE — Assessment & Plan Note (Signed)
Worsened.  Discussed options.  Will start Effexor 37.5 mg daily.  Will increase as tolerated.  He will check in with me in a couple of weeks.

## 2019-11-11 NOTE — Patient Instructions (Signed)
It was very nice to see you today!  We will start effexor today.  I will place a referral for you to see the surgeon.  I will also since in nicotine replacement.  Check in with me in a couple of weeks to let me know how things are going.   Take care, Dr Jerline Pain  Please try these tips to maintain a healthy lifestyle:   Eat at least 3 REAL meals and 1-2 snacks per day.  Aim for no more than 5 hours between eating.  If you eat breakfast, please do so within one hour of getting up.    Each meal should contain half fruits/vegetables, one quarter protein, and one quarter carbs (no bigger than a computer mouse)   Cut down on sweet beverages. This includes juice, soda, and sweet tea.     Drink at least 1 glass of water with each meal and aim for at least 8 glasses per day   Exercise at least 150 minutes every week.    Preventive Care 44-66 Years Old, Male Preventive care refers to lifestyle choices and visits with your health care provider that can promote health and wellness. This includes:  A yearly physical exam. This is also called an annual well check.  Regular dental and eye exams.  Immunizations.  Screening for certain conditions.  Healthy lifestyle choices, such as eating a healthy diet, getting regular exercise, not using drugs or products that contain nicotine and tobacco, and limiting alcohol use. What can I expect for my preventive care visit? Physical exam Your health care provider will check:  Height and weight. These may be used to calculate body mass index (BMI), which is a measurement that tells if you are at a healthy weight.  Heart rate and blood pressure.  Your skin for abnormal spots. Counseling Your health care provider may ask you questions about:  Alcohol, tobacco, and drug use.  Emotional well-being.  Home and relationship well-being.  Sexual activity.  Eating habits.  Work and work Statistician. What immunizations do I  need?  Influenza (flu) vaccine  This is recommended every year. Tetanus, diphtheria, and pertussis (Tdap) vaccine  You may need a Td booster every 10 years. Varicella (chickenpox) vaccine  You may need this vaccine if you have not already been vaccinated. Human papillomavirus (HPV) vaccine  If recommended by your health care provider, you may need three doses over 6 months. Measles, mumps, and rubella (MMR) vaccine  You may need at least one dose of MMR. You may also need a second dose. Meningococcal conjugate (MenACWY) vaccine  One dose is recommended if you are 58-40 years old and a Market researcher living in a residence hall, or if you have one of several medical conditions. You may also need additional booster doses. Pneumococcal conjugate (PCV13) vaccine  You may need this if you have certain conditions and were not previously vaccinated. Pneumococcal polysaccharide (PPSV23) vaccine  You may need one or two doses if you smoke cigarettes or if you have certain conditions. Hepatitis A vaccine  You may need this if you have certain conditions or if you travel or work in places where you may be exposed to hepatitis A. Hepatitis B vaccine  You may need this if you have certain conditions or if you travel or work in places where you may be exposed to hepatitis B. Haemophilus influenzae type b (Hib) vaccine  You may need this if you have certain risk factors. You may receive vaccines as  individual doses or as more than one vaccine together in one shot (combination vaccines). Talk with your health care provider about the risks and benefits of combination vaccines. What tests do I need? Blood tests  Lipid and cholesterol levels. These may be checked every 5 years starting at age 63.  Hepatitis C test.  Hepatitis B test. Screening   Diabetes screening. This is done by checking your blood sugar (glucose) after you have not eaten for a while (fasting).  Sexually  transmitted disease (STD) testing. Talk with your health care provider about your test results, treatment options, and if necessary, the need for more tests. Follow these instructions at home: Eating and drinking   Eat a diet that includes fresh fruits and vegetables, whole grains, lean protein, and low-fat dairy products.  Take vitamin and mineral supplements as recommended by your health care provider.  Do not drink alcohol if your health care provider tells you not to drink.  If you drink alcohol: ? Limit how much you have to 0-2 drinks a day. ? Be aware of how much alcohol is in your drink. In the [place] one drink equals one 12 oz bottle of beer (355 mL), one 5 oz glass of wine (148 mL), or one 1 oz glass of hard liquor (44 mL). Lifestyle  Take daily care of your teeth and gums.  Stay active. Exercise for at least 30 minutes on 5 or more days each week.  Do not use any products that contain nicotine or tobacco, such as cigarettes, e-cigarettes, and chewing tobacco. If you need help quitting, ask your health care provider.  If you are sexually active, practice safe sex. Use a condom or other form of protection to prevent STIs (sexually transmitted infections). What's next?  Go to your health care provider once a year for a well check visit.  Ask your health care provider how often you should have your eyes and teeth checked.  Stay up to date on all vaccines. This information is not intended to replace advice given to you by your health care provider. Make sure you discuss any questions you have with your health care provider. Document Revised: 09/16/2018 Document Reviewed: 09/16/2018 Elsevier Patient Education  2020 [employer].

## 2019-11-11 NOTE — Assessment & Plan Note (Signed)
Will stop oral finasteride.  Will start topical finasteride-Rogaine.

## 2019-11-11 NOTE — Assessment & Plan Note (Signed)
Stable.  Continue hydroxyzine 25 mg nightly as needed and Klonopin 0.25 to 0.5 mg nightly as needed.

## 2019-11-11 NOTE — Progress Notes (Signed)
Chief Complaint:  Scott Watts is a 39 y.o. male who presents today for his annual comprehensive physical exam.    Assessment/Plan:  Chronic Problems Addressed Today: Androgenic alopecia Will stop oral finasteride.  Will start topical finasteride-Rogaine.  Insomnia Stable.  Continue hydroxyzine 25 mg nightly as needed and Klonopin 0.25 to 0.5 mg nightly as needed.  Cigarette smoker Patient was asked about his tobacco use today and was strongly advised to quit. Patient is currently contemplative. We reviewed treatment options to assist him quit smoking including NRT, Chantix, and Bupropion.  He would like to try nicotine replacement.  We will send in a prescription for nicotine inhaler.  Follow up at next office visit.   Total time spent counseling approximately 5 minutes.    Erectile dysfunction Stable.  Will refill Cialis 20 mg daily.  Anxiety Worsened.  Discussed options.  Will start Effexor 37.5 mg daily.  Will increase as tolerated.  He will check in with me in a couple of weeks.  Hemorrhoids Will place referral to surgery for further management/evaluation.  Continue conservative management.  Preventative Healthcare: Check CBC, C met, TSH, lipid panel.  Patient Counseling(The following topics were reviewed and/or handout was given):  -Nutrition: Stressed importance of moderation in sodium/caffeine intake, saturated fat and cholesterol, caloric balance, sufficient intake of fresh fruits, vegetables, and fiber.  -Stressed the importance of regular exercise.   -Substance Abuse: Discussed cessation/primary prevention of tobacco, alcohol, or other drug use; driving or other dangerous activities under the influence; availability of treatment for abuse.   -Injury prevention: Discussed safety belts, safety helmets, smoke detector, smoking near bedding or upholstery.   -Sexuality: Discussed sexually transmitted diseases, partner selection, use of condoms, avoidance of unintended  pregnancy and contraceptive alternatives.   -Dental health: Discussed importance of regular tooth brushing, flossing, and dental visits.  -Health maintenance and immunizations reviewed. Please refer to Health maintenance section.  Return to care in 1 year for next preventative visit.     Subjective:  HPI:  Patient has several issues today that he would like addressed  He has had worsening mood.  He recently lost his brother due to a drug overdose.  Also been under quite a bit of stress at home and with work.  He has been on several medications in the past including Wellbutrin, Zoloft, and Lexapro.  Did do not think the Zoloft was particularly effective and did not like the side effects to Wellbutrin or Lexapro.  He has been working with a therapist for the past 8 years and thinks that this is going well.  Is interested in possibly trying Effexor today.  He has also been having issues with hemorrhoids for several years.  Please try conservative management including increasing his water intake and fiber intake however symptoms are persistent.  He is interested in possible surgical intervention at this point  He would like to try to quit smoking.  He does not want to try Chantix Wellbutrin.  He is interested in nicotine replacement.   Lifestyle Diet: Working with No[employer].  Exercise: Limited due to COVID.   Depression screen PHQ 2/9 08/27/2018  Decreased Interest 0  Down, Depressed, Hopeless 0  PHQ - 2 Score 0    Health Maintenance Due  Topic Date Due  . HIV Screening  05/20/1995     ROS: Per HPI, otherwise a complete review of systems was negative.   PMH:  The following were reviewed and entered/updated in epic: Past Medical History:  Diagnosis Date  .  Low serum vitamin B12    Patient Active Problem List   Diagnosis Date Noted  . Androgenic alopecia 03/01/2019  . Condyloma 03/01/2019  . Insomnia 01/24/2019  . Chronic right-sided low back pain with right-sided sciatica  10/26/2018  . Anxiety 08/27/2018  . Erectile dysfunction 08/27/2018  . Skin lesion 08/27/2018  . Cigarette smoker 08/27/2018   No past surgical history on file.  Family History  Problem Relation Age of Onset  . Cerebral aneurysm Mother   . Alcohol abuse Father   . Cancer Neg Hx     Medications- reviewed and updated Current Outpatient Medications  Medication Sig Dispense Refill  . clonazePAM (KLONOPIN) 0.5 MG tablet Take 0.5-1 tablets (0.25-0.5 mg total) by mouth 2 (two) times daily as needed for anxiety. 30 tablet 1  . hydrOXYzine (ATARAX/VISTARIL) 50 MG tablet Take 0.5-2 tablets (25-100 mg total) by mouth at bedtime as needed (insomnia). 90 tablet 1  . tadalafil (CIALIS) 20 MG tablet Take 0.5 tablets (10 mg total) by mouth daily as needed for erectile dysfunction. 30 tablet 5  . Finasteride-Minoxidil 0.1-7 % SOLN Apply 1 application topically daily. 60 g 5  . nicotine (NICOTROL) 10 MG inhaler Inhale 1 Cartridge (1 continuous puffing total) into the lungs as needed for smoking cessation. 42 each 0  . venlafaxine XR (EFFEXOR XR) 37.5 MG 24 hr capsule Take 1 capsule (37.5 mg total) by mouth daily with breakfast. 30 capsule 5   No current facility-administered medications for this visit.    Allergies-reviewed and updated No Known Allergies  Social History   Socioeconomic History  . Marital status: Single    Spouse name: Not on file  . Number of children: Not on file  . Years of education: Not on file  . Highest education level: Not on file  Occupational History  . Not on file  Tobacco Use  . Smoking status: Current Every Day Smoker  . Smokeless tobacco: Never Used  Substance and Sexual Activity  . Alcohol use: Yes    Alcohol/week: 2.0 standard drinks    Types: 2 Cans of beer per week  . Drug use: Never  . Sexual activity: Yes  Other Topics Concern  . Not on file  Social History Narrative  . Not on file   Social Determinants of Health   Financial Resource Strain:    . Difficulty of Paying Living Expenses: Not on file  Food Insecurity:   . Worried About Charity fundraiser in the Last Year: Not on file  . Ran Out of Food in the Last Year: Not on file  Transportation Needs:   . Lack of Transportation (Medical): Not on file  . Lack of Transportation (Non-Medical): Not on file  Physical Activity:   . Days of Exercise per Week: Not on file  . Minutes of Exercise per Session: Not on file  Stress:   . Feeling of Stress : Not on file  Social Connections:   . Frequency of Communication with Friends and Family: Not on file  . Frequency of Social Gatherings with Friends and Family: Not on file  . Attends Religious Services: Not on file  . Active Member of Clubs or Organizations: Not on file  . Attends Archivist Meetings: Not on file  . Marital Status: Not on file        Objective:  Physical Exam: BP 110/60   Pulse 72   Temp (!) 97.2 F (36.2 C)   Ht '6\' 3"'  (1.905 m)  Wt 211 lb 6.1 oz (95.9 kg)   SpO2 91%   BMI 26.42 kg/m   Body mass index is 26.42 kg/m. Wt Readings from Last 3 Encounters:  11/11/19 211 lb 6.1 oz (95.9 kg)  08/01/19 205 lb 4 oz (93.1 kg)  06/06/19 206 lb (93.4 kg)   Gen: NAD, resting comfortably HEENT: TMs normal bilaterally. OP clear. No thyromegaly noted.  CV: RRR with no murmurs appreciated Pulm: NWOB, CTAB with no crackles, wheezes, or rhonchi GI: Normal bowel sounds present. Soft, Nontender, Nondistended. MSK: no edema, cyanosis, or clubbing noted Skin: warm, dry Neuro: CN2-12 grossly intact. Strength 5/5 in upper and lower extremities. Reflexes symmetric and intact bilaterally.  Psych: Normal affect and thought content     Birney Belshe M. Jerline Pain, MD 11/11/2019 3:59 PM

## 2019-11-11 NOTE — Assessment & Plan Note (Signed)
Patient was asked about his tobacco use today and was strongly advised to quit. Patient is currently contemplative. We reviewed treatment options to assist him quit smoking including NRT, Chantix, and Bupropion.  He would like to try nicotine replacement.  We will send in a prescription for nicotine inhaler.  Follow up at next office visit.   Total time spent counseling approximately 5 minutes.

## 2019-11-11 NOTE — Assessment & Plan Note (Signed)
Stable.  Will refill Cialis 20 mg daily. 

## 2019-11-12 ENCOUNTER — Encounter: Payer: Self-pay | Admitting: Family Medicine

## 2019-11-12 DIAGNOSIS — E786 Lipoprotein deficiency: Secondary | ICD-10-CM | POA: Insufficient documentation

## 2019-11-12 LAB — COMPREHENSIVE METABOLIC PANEL
AG Ratio: 2.2 (calc) (ref 1.0–2.5)
ALT: 14 U/L (ref 9–46)
AST: 15 U/L (ref 10–40)
Albumin: 4.7 g/dL (ref 3.6–5.1)
Alkaline phosphatase (APISO): 71 U/L (ref 36–130)
BUN: 18 mg/dL (ref 7–25)
CO2: 25 mmol/L (ref 20–32)
Calcium: 9.7 mg/dL (ref 8.6–10.3)
Chloride: 108 mmol/L (ref 98–110)
Creat: 1.13 mg/dL (ref 0.60–1.35)
Globulin: 2.1 g/dL (calc) (ref 1.9–3.7)
Glucose, Bld: 84 mg/dL (ref 65–99)
Potassium: 4.5 mmol/L (ref 3.5–5.3)
Sodium: 140 mmol/L (ref 135–146)
Total Bilirubin: 2.5 mg/dL — ABNORMAL HIGH (ref 0.2–1.2)
Total Protein: 6.8 g/dL (ref 6.1–8.1)

## 2019-11-12 LAB — LIPID PANEL
Cholesterol: 152 mg/dL (ref <200)
HDL: 29 mg/dL — ABNORMAL LOW (ref >=40)
LDL Cholesterol (Calc): 98 mg/dL (calc)
Non-HDL Cholesterol (Calc): 123 mg/dL (calc) (ref <130)
Total CHOL/HDL Ratio: 5.2 (calc) — ABNORMAL HIGH (ref <5.0)
Triglycerides: 153 mg/dL — ABNORMAL HIGH (ref <150)

## 2019-11-12 LAB — TSH: TSH: 0.88 mIU/L (ref 0.40–4.50)

## 2019-11-12 LAB — CBC
HCT: 40.7 % (ref 38.5–50.0)
Hemoglobin: 14 g/dL (ref 13.2–17.1)
MCH: 34.1 pg — ABNORMAL HIGH (ref 27.0–33.0)
MCHC: 34.4 g/dL (ref 32.0–36.0)
MCV: 99 fL (ref 80.0–100.0)
MPV: 10.9 fL (ref 7.5–12.5)
Platelets: 185 10*3/uL (ref 140–400)
RBC: 4.11 10*6/uL — ABNORMAL LOW (ref 4.20–5.80)
RDW: 11 % (ref 11.0–15.0)
WBC: 8.5 10*3/uL (ref 3.8–10.8)

## 2019-11-12 NOTE — Progress Notes (Signed)
Please inform patient of the following:  His "good" cholesterol was a bit low, but everything else is NORMAL. He does not need to start any medications for this, but would like for him to continue working on diet and exercise and we can recheck again in a year or so.  Katina Degree. [name], MD 11/12/2019 8:58 AM

## 2019-11-14 ENCOUNTER — Telehealth: Payer: Self-pay | Admitting: Family Medicine

## 2019-11-14 ENCOUNTER — Other Ambulatory Visit: Payer: Self-pay

## 2019-11-14 ENCOUNTER — Encounter: Payer: Self-pay | Admitting: Family Medicine

## 2019-11-14 MED ORDER — CLONAZEPAM 0.5 MG PO TABS: 0.2500 mg | ORAL_TABLET | Freq: Two times a day (BID) | ORAL | 1 refills | Status: DC | PRN

## 2019-11-14 MED ORDER — HYDROXYZINE HCL 50 MG PO TABS: 25.0000 mg | ORAL_TABLET | Freq: Every evening | ORAL | 1 refills | Status: DC | PRN

## 2019-11-14 NOTE — Telephone Encounter (Signed)
Not able to send in finasteride only - do not think that this is available in the [place].  Katina Degree. [name], MD 11/14/2019 12:45 PM

## 2019-11-14 NOTE — Telephone Encounter (Signed)
Reason for Call Request to speak to Physician Initial Comment Caller states she is calling from [facility] needing clarification on a prescription. Additional Comment Pharmacy Name [facility] Pharmacist Name [facility] Number [phone] Translation No Nurse Assessment Nurse: Mechele Dawley, RN, Traci Date/Time Lamount Cohen Time): 11/11/2019 5:21:44 PM Confirm and document reason for call. If symptomatic, describe symptoms. ---Pharmacist states the drug is Finasteride Monoxadil. Rx is not a drug that the pharmacy is aware of that is dispensable in the [place] and wasn't sure if physician wanted the over the counter version of Monoxadil or if they needed to find a compounding pharmacy to make it. Pharmacy states she is ok with a message being sent to office to be handled on Monday.

## 2019-11-14 NOTE — Telephone Encounter (Signed)
Spoke with pharmacy Rx sent,patient notified. See patient notes

## 2019-11-14 NOTE — Telephone Encounter (Signed)
Patient requesting Finasteride solution only.Combo not authorized

## 2019-11-14 NOTE — Telephone Encounter (Signed)
Sent a Agricultural consultant

## 2019-11-14 NOTE — Telephone Encounter (Signed)
Please reach out to patient and pharmacy - Rx were sent in on our end. Please see result notes for recent labs.  Please see note regarding rogaine.  Katina Degree. [name], MD 11/14/2019 3:08 PM

## 2019-11-16 ENCOUNTER — Encounter: Payer: Self-pay | Admitting: Surgery

## 2019-11-16 ENCOUNTER — Ambulatory Visit: Payer: BC Managed Care – PPO | Admitting: Surgery

## 2019-11-16 ENCOUNTER — Other Ambulatory Visit: Payer: Self-pay

## 2019-11-16 VITALS — BP 123/73 | HR 73 | Temp 98.1°F | Resp 14 | Ht 75.0 in | Wt 210.0 lb

## 2019-11-16 DIAGNOSIS — K644 Residual hemorrhoidal skin tags: Secondary | ICD-10-CM | POA: Diagnosis not present

## 2019-11-16 DIAGNOSIS — K648 Other hemorrhoids: Secondary | ICD-10-CM

## 2019-11-16 MED ORDER — LIDOCAINE 5 % EX OINT: 1.0000 "application " | TOPICAL_OINTMENT | Freq: Three times a day (TID) | CUTANEOUS | 0 refills | Status: DC | PRN

## 2019-11-16 MED ORDER — HYDROCORTISONE (PERIANAL) 2.5 % EX CREA: 1.0000 "application " | TOPICAL_CREAM | Freq: Two times a day (BID) | CUTANEOUS | 1 refills | Status: DC

## 2019-11-16 NOTE — Patient Instructions (Addendum)
May try Anusol cream to help with your hemorrhoids. May also use Lidocaine ointment for pain.  Start to use this at the first sign of flair up.  Follow up here in 4 weeks. You may cancel this appointment if your treatment is working.    How to Take a [employer] A sitz bath is a warm water bath that may be used to care for your rectum, genital area, or the area between your rectum and genitals (perineum). For a sitz bath, the water only comes up to your hips and covers your buttocks. A sitz bath may done at home in a bathtub or with a portable sitz bath that fits over the toilet. Your health care provider may recommend a sitz bath to help:  Relieve pain and discomfort after delivering a baby.  Relieve pain and itching from hemorrhoids or anal fissures.  Relieve pain after certain surgeries.  Relax muscles that are sore or tight. How to take a sitz bath Take 3-4 sitz baths a day, or as many as told by your health care provider. Bathtub sitz bath To take a sitz bath in a bathtub: 1. Partially fill a bathtub with warm water. The water should be deep enough to cover your hips and buttocks when you are sitting in the tub. 2. If your health care provider told you to put medicine in the water, follow his or her instructions. 3. Sit in the water. 4. Open the tub drain a little, and leave it open during your bath. 5. Turn on the warm water again, enough to replace the water that is draining out. Keep the water running throughout your bath. This helps keep the water at the right level and the right temperature. 6. Soak in the water for 15-20 minutes, or as long as told by your health care provider. 7. When you are done, be careful when you stand up. You may feel dizzy. 8. After the sitz bath, pat yourself dry. Do not rub your skin to dry it.  Over-the-toilet sitz bath To take a sitz bath with an over-the-toilet basin: 1. Follow the manufacturer's instructions. 2. Fill the basin with warm  water. 3. If your health care provider told you to put medicine in the water, follow his or her instructions. 4. Sit on the seat. Make sure the water covers your buttocks and perineum. 5. Soak in the water for 15-20 minutes, or as long as told by your health care provider. 6. After the sitz bath, pat yourself dry. Do not rub your skin to dry it. 7. Clean and dry the basin between uses. 8. Discard the basin if it cracks, or according to the manufacturer's instructions. Contact a health care provider if:  Your symptoms get worse. Do not continue with sitz baths if your symptoms get worse.  You have new symptoms. If this happens, do not continue with sitz baths until you talk with your health care provider. Summary  A sitz bath is a warm water bath in which the water only comes up to your hips and covers your buttocks.  A sitz bath may help relieve itching, relieve pain, and relax muscles that are sore or tight in the lower part of your body, including your genital area.  Take 3-4 sitz baths a day, or as many as told by your health care provider. Soak in the water for 15-20 minutes.  Do not continue with sitz baths if your symptoms get worse. This information is not intended to replace  advice given to you by your health care provider. Make sure you discuss any questions you have with your health care provider. Document Revised: 02/21/2019 Document Reviewed: 09/24/2017 Elsevier Patient Education  2020 [employer].

## 2019-11-17 ENCOUNTER — Encounter: Payer: Self-pay | Admitting: Surgery

## 2019-11-17 NOTE — Progress Notes (Signed)
11/17/2019  Reason for Visit:  External hemorrhoids  Referring Provider:  Dimas Chyle, MD  History of Present Illness: Scott Watts is a 39 y.o. male presenting for evaluation of recurrent issues with external hemorrhoids.  The patient reports that he's been dealing with this for maybe 5 years.  He has tried different methods including fiber supplementation, avoiding constipation, doing Sitz baths, and using Preparation H and Proctofoam, but he reports that overall he continues to have issues.  He recently had a flare up and feels that the area is still swollen.  He reports some occasional bleeding/spotting as well.  Denies any pain during bowel movement.    Past Medical History: Past Medical History:  Diagnosis Date  . Low serum vitamin B12      Past Surgical History: Past Surgical History:  Procedure Laterality Date  . COLONOSCOPY  2014  . CONDYLOMA EXCISION/FULGURATION      Home Medications: Prior to Admission medications   Medication Sig Start Date End Date Taking? Authorizing Provider  clonazePAM (KLONOPIN) 0.5 MG tablet Take 0.5-1 tablets (0.25-0.5 mg total) by mouth 2 (two) times daily as needed for anxiety. 11/14/19  Yes Vivi Barrack, MD  Finasteride-Minoxidil 0.1-7 % SOLN Apply 1 application topically daily. 11/11/19  Yes Vivi Barrack, MD  hydrOXYzine (ATARAX/VISTARIL) 50 MG tablet Take 0.5-2 tablets (25-100 mg total) by mouth at bedtime as needed (insomnia). 11/14/19  Yes Vivi Barrack, MD  nicotine (NICOTROL) 10 MG inhaler Inhale 1 Cartridge (1 continuous puffing total) into the lungs as needed for smoking cessation. 11/11/19  Yes Vivi Barrack, MD  tadalafil (CIALIS) 20 MG tablet Take 0.5 tablets (10 mg total) by mouth daily as needed for erectile dysfunction. 11/11/19  Yes Vivi Barrack, MD  venlafaxine XR (EFFEXOR XR) 37.5 MG 24 hr capsule Take 1 capsule (37.5 mg total) by mouth daily with breakfast. 11/11/19  Yes Vivi Barrack, MD  hydrocortisone (ANUSOL-HC) 2.5 %  rectal cream Place 1 application rectally 2 (two) times daily. 11/16/19   Ahmoni Edge, Jacqulyn Bath, MD  lidocaine (XYLOCAINE) 5 % ointment Apply 1 application topically 3 (three) times daily as needed. 11/16/19   Olean Ree, MD    Allergies: No Known Allergies  Social History:  reports that he has been smoking cigarettes. He has been smoking about 0.50 packs per day. He has never used smokeless tobacco. He reports current alcohol use of about 2.0 standard drinks of alcohol per week. He reports that he does not use drugs.   Family History: Family History  Problem Relation Age of Onset  . Cerebral aneurysm Mother   . Alcohol abuse Father   . Cancer Neg Hx     Review of Systems: Review of Systems  Constitutional: Negative for chills and fever.  HENT: Negative for hearing loss.   Eyes: Negative for blurred vision.  Respiratory: Negative for shortness of breath.   Cardiovascular: Negative for chest pain.  Gastrointestinal: Negative for abdominal pain, constipation, diarrhea, nausea and vomiting.  Genitourinary: Negative for dysuria.  Musculoskeletal: Negative for myalgias.  Skin: Negative for rash.  Neurological: Negative for dizziness.  Psychiatric/Behavioral: Negative for depression.    Physical Exam BP 123/73   Pulse 73   Temp 98.1 F (36.7 C)   Resp 14   Ht 6\' 3"  (1.905 m)   Wt 210 lb (95.3 kg)   SpO2 98%   BMI 26.25 kg/m  CONSTITUTIONAL: No acute distress HEENT:  Normocephalic, atraumatic, extraocular motion intact. NECK: Trachea is midline, and there  is no jugular venous distension.  RESPIRATORY:  Lungs are clear, and breath sounds are equal bilaterally. Normal respiratory effort without pathologic use of accessory muscles. CARDIOVASCULAR: Heart is regular without murmurs, gallops, or rubs. GI: The abdomen is soft, non-distended, non-tender.  RECTAL:  External exam reveals a mildly enlarged external hemorrhoid in the left lateral column.  There is no significant induration or  erythema, and does not appear acutely thrombosed.  He does express some discomfort when pressing on that column.  On digital exam, there may be a slightly enlarged internal component as well in the left latera column.  The other two columns are overall normal.  No gross blood on glove. MUSCULOSKELETAL:  Normal muscle strength and tone in all four extremities.  No peripheral edema or cyanosis. SKIN: Skin turgor is normal. There are no pathologic skin lesions.  NEUROLOGIC:  Motor and sensation is grossly normal.  Cranial nerves are grossly intact. PSYCH:  Alert and oriented to person, place and time. Affect is normal.  Laboratory Analysis: No results found for this or any previous visit (from the past 24 hour(s)).  Imaging: No results found.  Assessment and Plan: This is a 39 y.o. male with chronic external hemorrhoid issues.  --Discussed with the patient that he has symptoms reflecting issues with both internal and external hemorrhoids.  Overall, the left lateral column is the only one affected.   --Discussed the conservative management of hemorrhoids, including avoiding constipation or straining but taking Fiber and MiraLax as needed, changing bowel habits to avoid adding more pressure to the hemorrhoid columns, Sitz baths to help soothe the tissues, and medications to reduce inflammation/pain during flareups.  He has overall tried multiple if not all the conservative measures, but he has not tried any prescription medications to help with the inflammation or pain during flareups.  He does not want to have any surgery at this point, and I think it's too early to discuss surgery as well. --He asked about non-invasive options for hemorrhoid treatment.  I discussed with him that banding could be a possibility, but this would only help for the internal component, and in his case, it is not significantly enlarged, so I do not think it would give him the yield or improvement that he is looking  for. --Will give him a prescription for Anusol ointment as well as Lidocaine ointment that he can apply to the hemorrhoidal tissue.  He will follow up with me in about a month.  If there is no improvement with the medications or if he continues to have flare up issues, then we could discuss hemorrhoidectomy as an option.  Face-to-face time spent with the patient and care providers was 60 minutes, with more than 50% of the time spent counseling, educating, and coordinating care of the patient.     Howie Ill, MD [facility]

## 2019-12-14 ENCOUNTER — Ambulatory Visit: Payer: BC Managed Care – PPO | Admitting: Surgery

## 2019-12-15 ENCOUNTER — Other Ambulatory Visit: Payer: Self-pay | Admitting: Family Medicine

## 2020-01-17 DIAGNOSIS — F4322 Adjustment disorder with anxiety: Secondary | ICD-10-CM | POA: Diagnosis not present

## 2020-01-31 DIAGNOSIS — F4322 Adjustment disorder with anxiety: Secondary | ICD-10-CM | POA: Diagnosis not present

## 2020-02-14 DIAGNOSIS — F4322 Adjustment disorder with anxiety: Secondary | ICD-10-CM | POA: Diagnosis not present

## 2020-02-22 ENCOUNTER — Telehealth: Payer: Self-pay | Admitting: *Deleted

## 2020-02-22 ENCOUNTER — Other Ambulatory Visit: Payer: Self-pay | Admitting: *Deleted

## 2020-02-22 ENCOUNTER — Other Ambulatory Visit: Payer: Self-pay | Admitting: Family Medicine

## 2020-02-22 MED ORDER — CLONAZEPAM 0.5 MG PO TABS: 0.2500 mg | ORAL_TABLET | Freq: Two times a day (BID) | ORAL | 0 refills | Status: DC | PRN

## 2020-02-22 NOTE — Telephone Encounter (Signed)
Pt requesting Rx clonazePAM (KLONOPIN)  Refill Last OV 11/14/2019

## 2020-02-23 MED ORDER — CLONAZEPAM 0.5 MG PO TABS: 0.2500 mg | ORAL_TABLET | Freq: Two times a day (BID) | ORAL | 0 refills | Status: DC | PRN

## 2020-02-28 DIAGNOSIS — F4322 Adjustment disorder with anxiety: Secondary | ICD-10-CM | POA: Diagnosis not present

## 2020-03-13 DIAGNOSIS — F4322 Adjustment disorder with anxiety: Secondary | ICD-10-CM | POA: Diagnosis not present

## 2020-03-27 DIAGNOSIS — F4322 Adjustment disorder with anxiety: Secondary | ICD-10-CM | POA: Diagnosis not present

## 2020-04-03 ENCOUNTER — Encounter: Payer: Self-pay | Admitting: Family Medicine

## 2020-04-03 ENCOUNTER — Ambulatory Visit: Payer: BC Managed Care – PPO | Admitting: Family Medicine

## 2020-04-03 ENCOUNTER — Other Ambulatory Visit: Payer: Self-pay

## 2020-04-03 VITALS — BP 124/72 | HR 72 | Temp 98.3°F | Ht 75.0 in | Wt 207.0 lb

## 2020-04-03 DIAGNOSIS — M79644 Pain in right finger(s): Secondary | ICD-10-CM

## 2020-04-03 MED ORDER — TRAMADOL HCL 50 MG PO TABS: 50.0000 mg | ORAL_TABLET | Freq: Four times a day (QID) | ORAL | 0 refills | Status: DC | PRN

## 2020-04-03 NOTE — Patient Instructions (Addendum)
Health Maintenance Due  Topic Date Due  . COVID-19 Vaccine (1) will call with dates  Never done  . HIV Screening address with PCP  Never done   Address: [address], [place], [place] [address]. [facility]- please arrive 7 30 to 7 40 and Dr. [name] will take good care of you  continue buddy taping for now.  Let us try tramadol before sleep tonight

## 2020-04-03 NOTE — Progress Notes (Signed)
Phone [phone] In person visit   Subjective:   Scott Watts is a 39 y.o. year old very pleasant male patient who presents for/with See problem oriented charting Chief Complaint  Patient presents with  . Hand Pain    x 3 days    This visit occurred during the SARS-CoV-2 public health emergency.  Safety protocols were in place, including screening questions prior to the visit, additional usage of staff PPE, and extensive cleaning of exam room while observing appropriate contact time as indicated for disinfecting solutions.   Past Medical History-  Patient Active Problem List   Diagnosis Date Noted  . Low HDL (under 40) 11/12/2019  . Androgenic alopecia 03/01/2019  . Condyloma 03/01/2019  . Insomnia 01/24/2019  . Chronic right-sided low back pain with right-sided sciatica 10/26/2018  . Anxiety 08/27/2018  . Erectile dysfunction 08/27/2018  . Skin lesion 08/27/2018  . Cigarette smoker 08/27/2018    Medications- reviewed and updated Current Outpatient Medications  Medication Sig Dispense Refill  . clonazePAM (KLONOPIN) 0.5 MG tablet Take 0.5-1 tablets (0.25-0.5 mg total) by mouth 2 (two) times daily as needed. for anxiety 30 tablet 0  . Finasteride-Minoxidil 0.1-7 % SOLN Apply 1 application topically daily. 60 g 5  . hydrocortisone (ANUSOL-HC) 2.5 % rectal cream Place 1 application rectally 2 (two) times daily. 30 g 1  . hydrOXYzine (ATARAX/VISTARIL) 50 MG tablet Take 0.5-2 tablets (25-100 mg total) by mouth at bedtime as needed (insomnia). 90 tablet 1  . lidocaine (XYLOCAINE) 5 % ointment Apply 1 application topically 3 (three) times daily as needed. 35.44 g 0  . nicotine (NICOTROL) 10 MG inhaler Inhale 1 Cartridge (1 continuous puffing total) into the lungs as needed for smoking cessation. 42 each 0  . tadalafil (CIALIS) 20 MG tablet Take 0.5 tablets (10 mg total) by mouth daily as needed for erectile dysfunction. 30 tablet 5  . traMADol (ULTRAM) 50 MG tablet Take 1 tablet  (50 mg total) by mouth every 6 (six) hours as needed for moderate pain or severe pain (do not drive for 6 hours after taking). 20 tablet 0  . venlafaxine XR (EFFEXOR XR) 37.5 MG 24 hr capsule Take 1 capsule (37.5 mg total) by mouth daily with breakfast. (Patient not taking: Reported on 04/03/2020) 30 capsule 5   No current facility-administered medications for this visit.     Objective:  BP 124/72   Pulse 72   Temp 98.3 F (36.8 C) (Temporal)   Ht 6\' 3"  (1.905 m)   Wt 207 lb (93.9 kg)   SpO2 91%   BMI 25.87 kg/m  Gen: NAD, resting comfortably CV: RRR no murmurs rubs or gallops Lungs: CTAB no crackles, wheeze, rhonchi Ext: no edema except for edema in the right ring finger as well bruising.  Patient very tender in touch distal phalanx-limited range of motion though I did not fully attempt range of motion due to patient pain and plan for sports medicine visit tomorrow       Assessment and Plan   #Right index finger Pain and right-handed patient S:[place] was reaching to get something in deepest portion of car. Slipped and finger went deeper into car. Right 4th finger with immediate pain. Looked like finger was bent back.  He attempted rest ice and elevation to see if symptoms would improve but unfortunately they have not.  He tried to ice it for over 48 hours without significant progress.  In fact this morning when he  woke up pain had worsened  He has attempted buddy taping which helps slightly mainly from making him be cognizant of the injury.  Not able to bend the finger fully.  Significant swelling and severe pain not responsive to ibuprofen 600 mg  Has not slept almost in the last 2 nights due to pain A/P: I am very concerned patient may have a fracture.  Unfortunately we do not have an Dentist onsite.  We considered sending patient to [facility] for [facility] today but ultimately we jointly decided on sports medicine referral where they can complete  x-ray at time of visit tomorrow morning.  Dr. [name] was kind enough to work patient in the morning around 740 to 8 AM -In the meantime for pain control I sent tramadol in for patient this evening.  Recommend not driving for at least 6 to 8 hours after taking this. -Patient lives in [place] and works in [place] so he is going to get home quickly and get some rest so he can wake up early and be pressure work day after seeing Dr. [name]  #Noted low baseline pulse ox-reviewed last visit with Dr. [name] in February and was also 91%.  Patient has been a long-term smoker but has recently quit and is vaping instead-he is using this to wean off of smoking completely.  Congratulated patient on his efforts and encouraged for cessation.  Recommended follow up: Return for as needed for new, worsening, persistent symptoms.  Lab/Order associations:   ICD-10-CM   1. Pain of finger of right hand  M79.644 Ambulatory referral to Sports Medicine   Meds ordered this encounter  Medications  . traMADol (ULTRAM) 50 MG tablet    Sig: Take 1 tablet (50 mg total) by mouth every 6 (six) hours as needed for moderate pain or severe pain (do not drive for 6 hours after taking).    Dispense:  20 tablet    Refill:  0   Time Spent: 31 minutes of total time (4:20 PM- 4: 42 PM, 9:17 PM- 9:26 PM) was spent on the date of the encounter performing the following actions: chart review prior to seeing the patient, obtaining history, performing a medically necessary exam, counseling on the treatment plan, placing orders, care coordination, and documenting in our EHR.  Much of this time was focused on urgent referral to sports medicine and facilitating.  Return precautions advised.  Tana Conch, MD

## 2020-04-04 ENCOUNTER — Ambulatory Visit: Payer: Self-pay

## 2020-04-04 ENCOUNTER — Ambulatory Visit (INDEPENDENT_AMBULATORY_CARE_PROVIDER_SITE_OTHER): Payer: BC Managed Care – PPO

## 2020-04-04 ENCOUNTER — Ambulatory Visit: Payer: BC Managed Care – PPO | Admitting: Nurse Practitioner

## 2020-04-04 ENCOUNTER — Ambulatory Visit: Payer: BC Managed Care – PPO | Admitting: Family Medicine

## 2020-04-04 ENCOUNTER — Encounter: Payer: Self-pay | Admitting: Family Medicine

## 2020-04-04 VITALS — Wt 208.0 lb

## 2020-04-04 DIAGNOSIS — S62634A Displaced fracture of distal phalanx of right ring finger, initial encounter for closed fracture: Secondary | ICD-10-CM | POA: Diagnosis not present

## 2020-04-04 DIAGNOSIS — M79644 Pain in right finger(s): Secondary | ICD-10-CM

## 2020-04-04 NOTE — Progress Notes (Signed)
Subjective:    I'm seeing this patient as a consultation for:  Dr. Durene Cal. Note will be routed back to referring provider/PCP.  CC: R ring finger pain  I, Molly Weber, LAT, ATC, am serving as scribe for [name].  HPI: Pt is a 39 y/o male presenting w/ c/o R ring finger (DIP) pain since last weekend when he slipped trying to get something out of his car and jammed his finger, pushing his distal R 4th phalanx into hyperextension.  He locates his pain to right fourth digit DIP area.    Radiating pain: No Swelling: yes Bruising: yes Aggravating factors: Motion and touching his finger. Treatments tried: ice, buddy taping, IBU  Past medical history, Surgical history, Family history, Social history, Allergies, and medications have been entered into the medical record, reviewed.   Review of Systems: No new headache, visual changes, nausea, vomiting, diarrhea, constipation, dizziness, abdominal pain, skin rash, fevers, chills, night sweats, weight loss, swollen lymph nodes, body aches, joint swelling, muscle aches, chest pain, shortness of breath, mood changes, visual or auditory hallucinations.   Objective:   Weight equals 208 pounds General: Well Developed, well nourished, and in no acute distress.  Neuro/Psych: Alert and oriented x3, extra-ocular muscles intact, able to move all 4 extremities, sensation grossly intact. Skin: Warm and dry, no rashes noted.  Respiratory: Not using accessory muscles, speaking in full sentences, trachea midline.  Cardiovascular: Pulses palpable, no extremity edema. Abdomen: Does not appear distended. MSK: Right digit swollen bruising present mostly around DIP. Cap refill and sensation are intact distally. Motion present at MCP and PIP. Decreased motion at DIP however foot strength is intact to flexion and extension. Tender to palpation around DIP mostly at flexor side of joint.  Lab and Radiology Results  X-ray images right fourth digit obtained  today personally and independently reviewed Oblique fracture through the midportion of the distal phalanx with slight dorsal angulation and slight foreshortening. Await formal radiology review  Diagnostic Limited MSK Ultrasound of: Right hand fourth digit DIP Intact extensor and flexor tendon appearance on ultrasound. Impression: No obvious tear of flexor tendon or extensor tendon at DIP   Impression and Recommendations:    Assessment and Plan: 39 y.o. male with right hand injury.  X-ray today shows oblique fracture however theology overread is still pending.  Patient has quite a bit of guarding with exam and is not able to give full effort for strength testing.  However I do not see tendon tear on ultrasound.  Plan for STAX splint and recheck in about 2 weeks.  Discussed possibility of surgical consultation.  I think there is a good chance this will heal and will follow healing if not healing appropriately or angulation changing will refer to surgery.   Orders Placed This Encounter  Procedures  . [place] LIMITED JOINT SPACE STRUCTURES UP RIGHT(NO LINKED CHARGES)    Order Specific Question:   Reason for Exam (SYMPTOM  OR DIAGNOSIS REQUIRED)    Answer:   eval right finger    Order Specific Question:   Preferred imaging location?    Answer:   Adult [facility]  . DG Finger Ring Right    Standing Status:   Future    Number of Occurrences:   1    Standing Expiration Date:   04/04/2021    Order Specific Question:   Reason for Exam (SYMPTOM  OR DIAGNOSIS REQUIRED)    Answer:   eval right finger    Order Specific  Question:   Preferred imaging location?    Answer:   [facility]    Order Specific Question:   Radiology Contrast Protocol - do NOT remove file path    Answer:   [email or web address]   No orders of the defined types were placed in this encounter.   Discussed warning signs or symptoms. Please see discharge instructions.  Patient expresses understanding.   The above documentation has been reviewed and is accurate and complete [name], M.D.

## 2020-04-04 NOTE — Patient Instructions (Addendum)
Thank you for coming in today. Continue the STAX splint.  You may be able to go to a size 3.5 or 3 soon.  Recheck in about 2 weeks.  Use tramadol as needed for severe pain especially in the evening.  Continue ibuprofen or aleve or tylenol for pain during the day.    Start Vit D3 over the counter. About 5000 units per day.  Calcium is a good idea as well. 1 extra strength tums (about 1000mg  of calcium) 2x daily.

## 2020-04-05 NOTE — Progress Notes (Signed)
Fracture present like we discussed.

## 2020-04-10 DIAGNOSIS — F4322 Adjustment disorder with anxiety: Secondary | ICD-10-CM | POA: Diagnosis not present

## 2020-04-16 ENCOUNTER — Telehealth: Payer: Self-pay | Admitting: Family Medicine

## 2020-04-16 MED ORDER — CLONAZEPAM 0.5 MG PO TABS: 0.2500 mg | ORAL_TABLET | Freq: Two times a day (BID) | ORAL | 5 refills | Status: DC | PRN

## 2020-04-16 NOTE — Telephone Encounter (Signed)
  LAST APPOINTMENT DATE: 11/11/2019  NEXT APPOINTMENT DATE:@7 /22/2021  MEDICATION: clonazePAM (KLONOPIN) 0.5 MG tablet  PHARMACY:[id number] - , [place] - [address]  Comments:Patient is completely out

## 2020-04-16 NOTE — Addendum Note (Signed)
Addended by: Ardith Dark on: 04/16/2020 12:47 PM   Modules accepted: Orders

## 2020-04-16 NOTE — Telephone Encounter (Signed)
Pt requesting Rx Klonopin Refills

## 2020-04-17 ENCOUNTER — Ambulatory Visit (INDEPENDENT_AMBULATORY_CARE_PROVIDER_SITE_OTHER): Payer: BC Managed Care – PPO

## 2020-04-17 ENCOUNTER — Ambulatory Visit: Payer: BC Managed Care – PPO | Admitting: Family Medicine

## 2020-04-17 ENCOUNTER — Other Ambulatory Visit: Payer: Self-pay

## 2020-04-17 ENCOUNTER — Encounter: Payer: Self-pay | Admitting: Family Medicine

## 2020-04-17 VITALS — BP 120/80 | HR 82 | Ht 75.0 in | Wt 206.0 lb

## 2020-04-17 DIAGNOSIS — S62634A Displaced fracture of distal phalanx of right ring finger, initial encounter for closed fracture: Secondary | ICD-10-CM

## 2020-04-17 DIAGNOSIS — S62632A Displaced fracture of distal phalanx of right middle finger, initial encounter for closed fracture: Secondary | ICD-10-CM | POA: Diagnosis not present

## 2020-04-17 NOTE — Patient Instructions (Signed)
Thank you for coming in today. Lets get a second surgical opinion about need for surgery here.  If conservative management is indicated then I am happy to continue it.  Otherwise recheck in 2 weeks.

## 2020-04-17 NOTE — Progress Notes (Signed)
Scott Watts, am serving as a Neurosurgeon for [name].  Scott Watts is a 39 y.o. male who presents to [facility] at [facility] today for follow up of R ring finger pain. Patient was last seen by Dr. [name] on 04/04/2020 stating R ring finger (DIP) pain since last weekend when he slipped trying to get something out of his car and jammed his finger, pushing his distal R 4th phalanx into hyperextension.  He locates his pain to right fourth digit DIP area. Patient had x-ray showed fracture and finger was put in a splint, patient has tramadol for severe pain.   Since last visit patient reports still having pain. Is doing all the vitamins he is supposed to. Is using the size 3 splint feels like it could be a little tighter so sometimes will use a band aid to help. Swelling has decreased a lot.   He notes his goal is to have his finger get better as fast as possible and after consideration is willing to consider surgery if needed.   Pertinent review of systems: No fevers or chills  Relevant historical information: Cigarette smoker   Exam:  BP 120/80 (BP Location: Left Arm, Patient Position: Sitting, Cuff Size: Normal)   Pulse 82   Ht 6\' 3"  (1.905 m)   Wt 206 lb (93.4 kg)   SpO2 90%   BMI 25.75 kg/m  General: Well Developed, well nourished, and in no acute distress.   MSK: Left fourth digit slightly swollen at distal phalanx however otherwise normal-appearing with no deformity. Intact sensation and capillary refill. Motion intact MCP and PIP.  Motion not tested at DIP.    Lab and Radiology Results X-ray images left fourth digit obtained today personally independently reviewed. Persistent oblique slightly displaced and foreshortened fracture at distal phalanx.  No significant change in the interval 2 weeks since original fracture.  No significant healing present yet per my interpretation. Await formal radiology review     Assessment and Plan: 39 y.o. male  with left finger fracture at distal phalanx.  Fracture type is oblique and somewhat displaced and foreshortened.  Unfortunately he has not had much healing yet visible on x-ray.  After discussion with him his goal has switched from avoiding surgery to try to get it healed as fast as possible.  Based on this I think it is worthwhile to have at least a surgical consultation to discuss his options.  I think it probably will heal faster with surgery however surgical psych opinion is reasonable.  If after discussion with surgeon he wishes to continue conservative management I am happy to continue provide conservative management.   PDMP not reviewed this encounter. Orders Placed This Encounter  Procedures  . DG Finger Ring Right    Standing Status:   Future    Number of Occurrences:   1    Standing Expiration Date:   04/17/2021    Order Specific Question:   Reason for Exam (SYMPTOM  OR DIAGNOSIS REQUIRED)    Answer:   eval fx    Order Specific Question:   Preferred imaging location?    Answer:   04/19/2021    Order Specific Question:   Radiology Contrast Protocol - do NOT remove file path    Answer:   [email or web address]  . Ambulatory referral to Orthopedic Surgery    Referral Priority:   Routine    Referral Type:   Surgical    Referral Reason:   Specialty  Services Required    Requested Specialty:   Orthopedic Surgery    Number of Visits Requested:   1   No orders of the defined types were placed in this encounter.    Discussed warning signs or symptoms. Please see discharge instructions. Patient expresses understanding.   The above documentation has been reviewed and is accurate and complete [name], M.D.

## 2020-04-18 ENCOUNTER — Telehealth: Payer: Self-pay | Admitting: Family Medicine

## 2020-04-18 NOTE — Telephone Encounter (Signed)
Pt called today and seems a little confused about who he is being referred to for his hand. He called Dr. Francesco Sor today [facility] and they had no record of a referral from [place], I see a referral to [facility], so I was unsure.  He would prefer [facility], but ultimately would like to go wherever he can be seen the soonest. Please call to clarify referral location so that he can reach out for scheduling.

## 2020-04-18 NOTE — Progress Notes (Signed)
Finger fracture looks stable to radiology.

## 2020-04-24 DIAGNOSIS — F4322 Adjustment disorder with anxiety: Secondary | ICD-10-CM | POA: Diagnosis not present

## 2020-04-24 DIAGNOSIS — S62664A Nondisplaced fracture of distal phalanx of right ring finger, initial encounter for closed fracture: Secondary | ICD-10-CM | POA: Diagnosis not present

## 2020-04-26 ENCOUNTER — Ambulatory Visit: Payer: BC Managed Care – PPO | Admitting: Family Medicine

## 2020-04-26 ENCOUNTER — Other Ambulatory Visit: Payer: Self-pay

## 2020-04-26 ENCOUNTER — Encounter: Payer: Self-pay | Admitting: Family Medicine

## 2020-04-26 VITALS — BP 117/70 | HR 70 | Temp 98.5°F | Ht 75.0 in | Wt 209.0 lb

## 2020-04-26 DIAGNOSIS — G4700 Insomnia, unspecified: Secondary | ICD-10-CM | POA: Diagnosis not present

## 2020-04-26 DIAGNOSIS — F17200 Nicotine dependence, unspecified, uncomplicated: Secondary | ICD-10-CM

## 2020-04-26 DIAGNOSIS — F419 Anxiety disorder, unspecified: Secondary | ICD-10-CM | POA: Diagnosis not present

## 2020-04-26 MED ORDER — BUSPIRONE HCL 5 MG PO TABS: 5.0000 mg | ORAL_TABLET | Freq: Two times a day (BID) | ORAL | 0 refills | Status: DC

## 2020-04-26 NOTE — Patient Instructions (Signed)
It was very nice to see you today!  We will start BuSpar.  Please check in with me in a few weeks after you start this medication.  Keep up the good work with your diet and exercise.  Take care, Dr [name]  Please try these tips to maintain a healthy lifestyle:   Eat at least 3 REAL meals and 1-2 snacks per day.  Aim for no more than 5 hours between eating.  If you eat breakfast, please do so within one hour of getting up.    Each meal should contain half fruits/vegetables, one quarter protein, and one quarter carbs (no bigger than a computer mouse)   Cut down on sweet beverages. This includes juice, soda, and sweet tea.     Drink at least 1 glass of water with each meal and aim for at least 8 glasses per day   Exercise at least 150 minutes every week.

## 2020-04-26 NOTE — Assessment & Plan Note (Signed)
Had lengthy discussion regarding treatment options.  He did not start the Effexor.  Is worried about side effects.  Will start very low-dose BuSpar 5 mg twice daily.  He will continue taking Klonopin.  He will check in with me in a few weeks after starting and we will adjust the dose as needed.

## 2020-04-26 NOTE — Progress Notes (Signed)
   Scott Watts is a 39 y.o. male who presents today for an office visit.  Assessment/Plan:  Chronic Problems Addressed Today: Insomnia Stable.  Continue hydroxyzine 25 mg nightly and Klonopin 0.25 mg nightly.  Nicotine dependence with current use Patient was asked about his nicotine use today and was strongly advised to quit. Patient is currently contemplative. We reviewed treatment options to assist him quit smoking including NRT, Chantix, and Bupropion.  He has switched to vaping in hopes that he will be able to wean down with this.  Follow up at next office visit.   Total time spent counseling approximately 3 minutes.    Anxiety Had lengthy discussion regarding treatment options.  He did not start the Effexor.  Is worried about side effects.  Will start very low-dose BuSpar 5 mg twice daily.  He will continue taking Klonopin.  He will check in with me in a few weeks after starting and we will adjust the dose as needed.  Overweight Congratulated patient on recent lifestyle changes including trying to eat healthier and be more active.  Discussed continue lifestyle modifications    Subjective:  HPI:  See A/p.        Objective:  Physical Exam: BP 117/70   Pulse 70   Temp 98.5 F (36.9 C)   Ht 6\' 3"  (1.905 m)   Wt (!) 209 lb (94.8 kg)   SpO2 92%   BMI 26.12 kg/m   Wt Readings from Last 3 Encounters:  04/26/20 (!) 209 lb (94.8 kg)  04/17/20 206 lb (93.4 kg)  04/04/20 208 lb (94.3 kg)  Gen: No acute distress, resting comfortably CV: Regular rate and rhythm with no murmurs appreciated Pulm: Normal work of breathing, clear to auscultation bilaterally with no crackles, wheezes, or rhonchi Neuro: Grossly normal, moves all extremities Psych: Normal affect and thought content      Anne-Marie Genson M. 04/06/20, MD 04/26/2020 2:35 PM

## 2020-04-26 NOTE — Assessment & Plan Note (Signed)
Stable.  Continue hydroxyzine 25 mg nightly and Klonopin 0.25 mg nightly.

## 2020-04-26 NOTE — Assessment & Plan Note (Signed)
Patient was asked about his nicotine use today and was strongly advised to quit. Patient is currently contemplative. We reviewed treatment options to assist him quit smoking including NRT, Chantix, and Bupropion.  He has switched to vaping in hopes that he will be able to wean down with this.  Follow up at next office visit.   Total time spent counseling approximately 3 minutes.

## 2020-05-08 DIAGNOSIS — F4322 Adjustment disorder with anxiety: Secondary | ICD-10-CM | POA: Diagnosis not present

## 2020-05-22 DIAGNOSIS — F4322 Adjustment disorder with anxiety: Secondary | ICD-10-CM | POA: Diagnosis not present

## 2020-05-30 ENCOUNTER — Encounter: Payer: Self-pay | Admitting: Family Medicine

## 2020-06-01 ENCOUNTER — Telehealth: Payer: Self-pay | Admitting: Family Medicine

## 2020-06-01 ENCOUNTER — Other Ambulatory Visit: Payer: Self-pay | Admitting: *Deleted

## 2020-06-01 MED ORDER — BUSPIRONE HCL 5 MG PO TABS: 5.0000 mg | ORAL_TABLET | Freq: Two times a day (BID) | ORAL | 0 refills | Status: DC

## 2020-06-01 NOTE — Telephone Encounter (Signed)
Rx send to PHARMACY: [facility] [address] [place], [place] [address] Pt notified

## 2020-06-01 NOTE — Telephone Encounter (Signed)
MEDICATION: busPIRone (BUSPAR) 5 MG tablet  PHARMACY: [facility] [address] [place], [place] [address]    Comments: Patient is completely out!  **Let patient know to contact pharmacy at the end of the day to make sure medication is ready. **  ** Please notify patient to allow 48-72 hours to process**  **Encourage patient to contact the pharmacy for refills or they can request refills through [facility]**

## 2020-06-01 NOTE — Telephone Encounter (Signed)
Rx send to [facility]

## 2020-06-01 NOTE — Telephone Encounter (Signed)
Patient called regarding RX again patient is currently on vacation in [place] and it leaving to go home Saturday morning and then again is leaving for [place] on Monday morning, patient is requesting RX to be filled prior to him leaving Monday. If its late today can we just sent it to his regular pharmacy so he can pick it up tomorrow when he gets home [id number] - [place], [place] - [address]

## 2020-06-19 DIAGNOSIS — S62664A Nondisplaced fracture of distal phalanx of right ring finger, initial encounter for closed fracture: Secondary | ICD-10-CM | POA: Diagnosis not present

## 2020-06-19 DIAGNOSIS — F4322 Adjustment disorder with anxiety: Secondary | ICD-10-CM | POA: Diagnosis not present

## 2020-07-03 DIAGNOSIS — F4322 Adjustment disorder with anxiety: Secondary | ICD-10-CM | POA: Diagnosis not present

## 2020-07-06 ENCOUNTER — Telehealth: Payer: BC Managed Care – PPO | Admitting: Family Medicine

## 2020-07-06 MED ORDER — BUSPIRONE HCL 5 MG PO TABS: 5.0000 mg | ORAL_TABLET | Freq: Two times a day (BID) | ORAL | 0 refills | Status: DC

## 2020-07-09 ENCOUNTER — Telehealth (INDEPENDENT_AMBULATORY_CARE_PROVIDER_SITE_OTHER): Payer: BC Managed Care – PPO | Admitting: Family Medicine

## 2020-07-09 ENCOUNTER — Encounter: Payer: Self-pay | Admitting: Family Medicine

## 2020-07-09 VITALS — Temp 98.3°F | Ht 75.0 in | Wt 203.0 lb

## 2020-07-09 DIAGNOSIS — N529 Male erectile dysfunction, unspecified: Secondary | ICD-10-CM

## 2020-07-09 DIAGNOSIS — F419 Anxiety disorder, unspecified: Secondary | ICD-10-CM | POA: Diagnosis not present

## 2020-07-09 MED ORDER — BUSPIRONE HCL 10 MG PO TABS: 10.0000 mg | ORAL_TABLET | Freq: Two times a day (BID) | ORAL | 5 refills | Status: DC

## 2020-07-09 MED ORDER — TADALAFIL 20 MG PO TABS: 20.0000 mg | ORAL_TABLET | Freq: Every day | ORAL | 5 refills | Status: DC | PRN

## 2020-07-09 NOTE — Assessment & Plan Note (Signed)
Stable.  Continue Cialis 20 mg daily.  Refill sent in today.

## 2020-07-09 NOTE — Progress Notes (Signed)
   Scott Watts is a 39 y.o. male who presents today for a virtual office visit.  Assessment/Plan:  Chronic Problems Addressed Today: Erectile dysfunction Stable.  Continue Cialis 20 mg daily.  Refill sent in today.  Anxiety Uncontrolled.  Continue Klonopin 0.25 to 0.5 mg twice daily as needed.  Will increase dose of BuSpar to 10 mg twice daily.  Will follow with me in a couple weeks via MyChart.     Subjective:  HPI:  See A/p.         Objective/Observations  Physical Exam: Gen: NAD, resting comfortably Pulm: Normal work of breathing Psych: Normal affect and thought content  Virtual Visit via Video   I connected with Scott Watts on 07/09/20 at  4:00 PM EDT by a video enabled telemedicine application and verified that I am speaking with the correct person using two identifiers. The limitations of evaluation and management by telemedicine and the availability of in person appointments were discussed. The patient expressed understanding and agreed to proceed.   Patient location: Home Provider location: [place] [employer] [facility] Persons participating in the virtual visit: Myself and Patient     Scott Watts. Scott Ralph, MD 07/09/2020 4:09 PM

## 2020-07-09 NOTE — Progress Notes (Signed)
Patient scheduled for virtual visit. Was not able to reach patient after 3 attempts were made by phone. Patient was not seen today.  Katina Degree. [name], MD 07/09/2020 8:09 AM

## 2020-07-09 NOTE — Assessment & Plan Note (Addendum)
Uncontrolled.  Continue Klonopin 0.25 to 0.5 mg twice daily as needed.  Will increase dose of BuSpar to 10 mg twice daily.  Will follow with me in a couple weeks via MyChart.

## 2020-07-17 ENCOUNTER — Other Ambulatory Visit: Payer: Self-pay

## 2020-07-17 ENCOUNTER — Telehealth: Payer: Self-pay

## 2020-07-17 ENCOUNTER — Ambulatory Visit: Payer: BC Managed Care – PPO | Admitting: Physician Assistant

## 2020-07-17 ENCOUNTER — Encounter: Payer: Self-pay | Admitting: Physician Assistant

## 2020-07-17 VITALS — BP 110/72 | HR 68 | Temp 98.1°F | Ht 75.0 in | Wt 211.4 lb

## 2020-07-17 DIAGNOSIS — M546 Pain in thoracic spine: Secondary | ICD-10-CM

## 2020-07-17 DIAGNOSIS — F4322 Adjustment disorder with anxiety: Secondary | ICD-10-CM | POA: Diagnosis not present

## 2020-07-17 DIAGNOSIS — Z23 Encounter for immunization: Secondary | ICD-10-CM | POA: Diagnosis not present

## 2020-07-17 DIAGNOSIS — M542 Cervicalgia: Secondary | ICD-10-CM

## 2020-07-17 DIAGNOSIS — S060X0A Concussion without loss of consciousness, initial encounter: Secondary | ICD-10-CM | POA: Diagnosis not present

## 2020-07-17 MED ORDER — CLONAZEPAM 1 MG PO TABS: 1.0000 mg | ORAL_TABLET | Freq: Two times a day (BID) | ORAL | 1 refills | Status: DC | PRN

## 2020-07-17 MED ORDER — BACLOFEN 10 MG PO TABS: 10.0000 mg | ORAL_TABLET | Freq: Three times a day (TID) | ORAL | 0 refills | Status: DC | PRN

## 2020-07-17 MED ORDER — DICLOFENAC SODIUM 75 MG PO TBEC: 75.0000 mg | DELAYED_RELEASE_TABLET | Freq: Two times a day (BID) | ORAL | 0 refills | Status: DC

## 2020-07-17 NOTE — Patient Instructions (Signed)
It was great to see you!  Diclofenac to replace ibuprofen.  May increase klonopin to 1 tablet at night (new rx sent in.)  May trial baclofen muscle relaxer during the day  Referral for Dr. [name] at [facility]  An order for an xray has been put in for you --> up to you if you want to get them To get your xray, you can walk in at the [facility] location without a scheduled appointment.  The address is [address] It is across the street from [facility]. X-ray is located in the basement.  Hours of operation are M-F 8:30am to 5:00pm. Please note that they are closed for lunch between 12:30 and 1:00pm.    Concussion, Adult  A concussion is a brain injury from a hard, direct hit (trauma) to the head or body. This direct hit causes the brain to shake quickly back and forth inside the skull. This can damage brain cells and cause chemical changes in the brain. A concussion may also be known as a mild traumatic brain injury (TBI). Concussions are usually not life-threatening, but the effects of a concussion can be serious. If you have a concussion, you should be very careful to avoid having a second concussion. What are the causes? This condition is caused by:  A direct hit to your head, such as: ? Running into another player during a game. ? Being hit in a fight. ? Hitting your head on a hard surface.  Sudden movement of your body that causes your brain to move back and forth inside the skull, such as in a car crash. What are the signs or symptoms? The signs of a concussion can be hard to notice. Early on, they may be missed by you, family members, and health care providers. You may look fine on the outside but may act or feel differently. Symptoms are usually temporary and most often improve in 7-10 days. Some symptoms appear right away, but other symptoms may not show up for hours or days. If your symptoms last longer than normal, you may have post-concussion  syndrome. Every head injury is different. Physical symptoms  Headaches. This can include a feeling of pressure in the head or migraine-like symptoms.  Tiredness (fatigue).  Dizziness.  Problems with coordination or balance.  Vision or hearing problems.  Sensitivity to light or noise.  Nausea or vomiting.  Changes in eating or sleeping patterns.  Numbness or tingling.  Seizure. Mental and emotional symptoms  Memory problems.  Trouble concentrating, organizing, or making decisions.  Slowness in thinking, acting or reacting, speaking, or reading.  Irritability or mood changes.  Anxiety or depression. How is this diagnosed? This condition is diagnosed based on:  Your symptoms.  A description of your injury. You may also have tests, including:  Imaging tests, such as a CT scan or MRI.  Neuropsychological tests. These measure your thinking, understanding, learning, and remembering abilities. How is this treated? Treatment for this condition includes:  Stopping sports or activity if you are injured. If you hit your head or show signs of concussion: ? Do not return to sports or activities the same day. ? Get checked by a health care provider before you return to your activities.  Physical and mental rest and careful observation, usually at home. Gradually return to your normal activities.  Medicines to help with symptoms such as headaches, nausea, or difficulty sleeping. ? Avoid taking opioid pain medicine while recovering from a concussion.  Avoiding alcohol and drugs. These may slow your recovery and can put you at risk of further injury.  Referral to a concussion clinic or rehabilitation center. Recovery from a concussion can take time. How fast you recover depends on many factors. Return to activities only when:  Your symptoms are completely gone.  Your health care provider says that it is safe. Follow these instructions at home: Activity  Limit  activities that require a lot of thought or concentration, such as: ? Doing homework or job-related work. ? Watching TV. ? Working on the computer or phone. ? Playing memory games and puzzles.  Rest. Rest helps your brain heal. Make sure you: ? Get plenty of sleep. Most adults should get 7-9 hours of sleep each night. ? Rest during the day. Take naps or rest breaks when you feel tired.  Avoid physical activity like exercise until your health care provider says it is safe. Stop any activity that worsens symptoms.  Do not do high-risk activities that could cause a second concussion, such as riding a bike or playing sports.  Ask your health care provider when you can return to your normal activities, such as school, work, athletics, and driving. Your ability to react may be slower after a brain injury. Never do these activities if you are dizzy. Your health care provider will likely give you a plan for gradually returning to activities. General instructions   Take over-the-counter and prescription medicines only as told by your health care provider. Some medicines, such as blood thinners (anticoagulants) and aspirin, may increase the risk for complications, such as bleeding.  Do not drink alcohol until your health care provider says you can.  Watch your symptoms and tell others around you to do the same. Complications sometimes occur after a concussion. Older adults with a brain injury may have a higher risk of serious complications.  Tell your work Production designer, theatre/television/film, teachers, Tax adviser, school counselor, coach, or Event organiser about your injury, symptoms, and restrictions.  Keep all follow-up visits as told by your health care provider. This is important. How is this prevented? Avoiding another brain injury is very important. In rare cases, another injury can lead to permanent brain damage, brain swelling, or death. The risk of this is greatest during the first 7-10 days after a head injury.  Avoid injuries by:  Stopping activities that could lead to a second concussion, such as contact or recreational sports, until your health care provider says it is okay.  Taking these actions once you have returned to sports or activities: ? Avoiding plays or moves that can cause you to crash into another person. This is how most concussions occur. ? Following the rules and being respectful of other players. Do not engage in violent or illegal plays.  Getting regular exercise that includes strength and balance training.  Wearing a properly fitting helmet during sports, biking, or other activities. Helmets can help protect you from serious skull and brain injuries, but they do not protect you from a concussion. Even when wearing a helmet, you should avoid being hit in the head. Contact a health care provider if:  Your symptoms get worse or they do not improve.  You have new symptoms.  You have another injury. Get help right away if:  You have severe or worsening headaches.  You have weakness or numbness in any part of your body.  You are confused.  Your coordination gets worse.  You vomit repeatedly.  You are sleepier than normal.  Your speech is slurred.  You cannot recognize people or places.  You have a seizure.  It is difficult to wake you up.  You have unusual behavior changes.  You have changes in your vision.  You lose consciousness. Summary  A concussion is a brain injury that results from a hard, direct hit (trauma) to your head or body.  You may have imaging tests and neuropsychological tests to diagnose a concussion.  Treatment for this condition includes physical and mental rest and careful observation.  Ask your health care provider when you can return to your normal activities, such as school, work, athletics, and driving.  Get help right away if you have a severe headache, weakness on one side of the body, seizures, behavior changes, changes in  vision, or if you are confused or sleepier than normal. This information is not intended to replace advice given to you by your health care provider. Make sure you discuss any questions you have with your health care provider. Document Revised: 05/13/2018 Document Reviewed: 05/13/2018 Elsevier Patient Education  2020 [employer].
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
## 2020-07-18 ENCOUNTER — Encounter: Payer: Self-pay | Admitting: Family Medicine

## 2020-07-18 ENCOUNTER — Ambulatory Visit: Payer: BC Managed Care – PPO | Admitting: Family Medicine

## 2020-07-18 ENCOUNTER — Ambulatory Visit
Admission: RE | Admit: 2020-07-18 | Discharge: 2020-07-18 | Disposition: A | Discharge status: Home / Self Care | Payer: BC Managed Care – PPO | Attending: Chiropractor | Admitting: Chiropractor

## 2020-07-18 ENCOUNTER — Other Ambulatory Visit: Payer: Self-pay | Admitting: Chiropractor

## 2020-07-18 ENCOUNTER — Ambulatory Visit
Admission: RE | Admit: 2020-07-18 | Discharge: 2020-07-18 | Disposition: A | Discharge status: Home / Self Care | Payer: BC Managed Care – PPO | Source: Ambulatory Visit | Attending: Chiropractor | Admitting: Chiropractor

## 2020-07-18 VITALS — BP 102/62 | HR 69 | Ht 75.0 in | Wt 212.0 lb

## 2020-07-18 DIAGNOSIS — S161XXA Strain of muscle, fascia and tendon at neck level, initial encounter: Secondary | ICD-10-CM

## 2020-07-18 DIAGNOSIS — S338XXA Sprain of other parts of lumbar spine and pelvis, initial encounter: Secondary | ICD-10-CM

## 2020-07-18 DIAGNOSIS — S060X0A Concussion without loss of consciousness, initial encounter: Secondary | ICD-10-CM

## 2020-07-18 DIAGNOSIS — S134XXA Sprain of ligaments of cervical spine, initial encounter: Secondary | ICD-10-CM

## 2020-07-18 DIAGNOSIS — S233XXA Sprain of ligaments of thoracic spine, initial encounter: Secondary | ICD-10-CM

## 2020-07-18 MED ORDER — NORTRIPTYLINE HCL 25 MG PO CAPS: 25.0000 mg | ORAL_CAPSULE | Freq: Every day | ORAL | 2 refills | Status: DC
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
## 2020-07-18 NOTE — Patient Instructions (Addendum)
Thank you for coming in today.  Plan for PT.  Use nortriptyline at bedtime for sleep and headache.  Recheck in 2 weeks.    Concussion, Adult  A concussion is a brain injury from a hard, direct hit (trauma) to the head or body. This direct hit causes the brain to shake quickly back and forth inside the skull. This can damage brain cells and cause chemical changes in the brain. A concussion may also be known as a mild traumatic brain injury (TBI). Concussions are usually not life-threatening, but the effects of a concussion can be serious. If you have a concussion, you should be very careful to avoid having a second concussion. What are the causes? This condition is caused by:  A direct hit to your head, such as: ? Running into another player during a game. ? Being hit in a fight. ? Hitting your head on a hard surface.  Sudden movement of your body that causes your brain to move back and forth inside the skull, such as in a car crash. What are the signs or symptoms? The signs of a concussion can be hard to notice. Early on, they may be missed by you, family members, and health care providers. You may look fine on the outside but may act or feel differently. Symptoms are usually temporary and most often improve in 7-10 days. Some symptoms appear right away, but other symptoms may not show up for hours or days. If your symptoms last longer than normal, you may have post-concussion syndrome. Every head injury is different. Physical symptoms  Headaches. This can include a feeling of pressure in the head or migraine-like symptoms.  Tiredness (fatigue).  Dizziness.  Problems with coordination or balance.  Vision or hearing problems.  Sensitivity to light or noise.  Nausea or vomiting.  Changes in eating or sleeping patterns.  Numbness or tingling.  Seizure. Mental and emotional symptoms  Memory problems.  Trouble concentrating, organizing, or making decisions.  Slowness in  thinking, acting or reacting, speaking, or reading.  Irritability or mood changes.  Anxiety or depression. How is this diagnosed? This condition is diagnosed based on:  Your symptoms.  A description of your injury. You may also have tests, including:  Imaging tests, such as a CT scan or MRI.  Neuropsychological tests. These measure your thinking, understanding, learning, and remembering abilities. How is this treated? Treatment for this condition includes:  Stopping sports or activity if you are injured. If you hit your head or show signs of concussion: ? Do not return to sports or activities the same day. ? Get checked by a health care provider before you return to your activities.  Physical and mental rest and careful observation, usually at home. Gradually return to your normal activities.  Medicines to help with symptoms such as headaches, nausea, or difficulty sleeping. ? Avoid taking opioid pain medicine while recovering from a concussion.  Avoiding alcohol and drugs. These may slow your recovery and can put you at risk of further injury.  Referral to a concussion clinic or rehabilitation center. Recovery from a concussion can take time. How fast you recover depends on many factors. Return to activities only when:  Your symptoms are completely gone.  Your health care provider says that it is safe. Follow these instructions at home: Activity  Limit activities that require a lot of thought or concentration, such as: ? Doing homework or job-related work. ? Watching TV. ? Working on the computer or phone. ? Playing  memory games and puzzles.  Rest. Rest helps your brain heal. Make sure you: ? Get plenty of sleep. Most adults should get 7-9 hours of sleep each night. ? Rest during the day. Take naps or rest breaks when you feel tired.  Avoid physical activity like exercise until your health care provider says it is safe. Stop any activity that worsens symptoms.  Do  not do high-risk activities that could cause a second concussion, such as riding a bike or playing sports.  Ask your health care provider when you can return to your normal activities, such as school, work, athletics, and driving. Your ability to react may be slower after a brain injury. Never do these activities if you are dizzy. Your health care provider will likely give you a plan for gradually returning to activities. General instructions   Take over-the-counter and prescription medicines only as told by your health care provider. Some medicines, such as blood thinners (anticoagulants) and aspirin, may increase the risk for complications, such as bleeding.  Do not drink alcohol until your health care provider says you can.  Watch your symptoms and tell others around you to do the same. Complications sometimes occur after a concussion. Older adults with a brain injury may have a higher risk of serious complications.  Tell your work Production designer, theatre/television/film, teachers, Tax adviser, school counselor, coach, or Event organiser about your injury, symptoms, and restrictions.  Keep all follow-up visits as told by your health care provider. This is important. How is this prevented? Avoiding another brain injury is very important. In rare cases, another injury can lead to permanent brain damage, brain swelling, or death. The risk of this is greatest during the first 7-10 days after a head injury. Avoid injuries by:  Stopping activities that could lead to a second concussion, such as contact or recreational sports, until your health care provider says it is okay.  Taking these actions once you have returned to sports or activities: ? Avoiding plays or moves that can cause you to crash into another person. This is how most concussions occur. ? Following the rules and being respectful of other players. Do not engage in violent or illegal plays.  Getting regular exercise that includes strength and balance  training.  Wearing a properly fitting helmet during sports, biking, or other activities. Helmets can help protect you from serious skull and brain injuries, but they do not protect you from a concussion. Even when wearing a helmet, you should avoid being hit in the head. Contact a health care provider if:  Your symptoms get worse or they do not improve.  You have new symptoms.  You have another injury. Get help right away if:  You have severe or worsening headaches.  You have weakness or numbness in any part of your body.  You are confused.  Your coordination gets worse.  You vomit repeatedly.  You are sleepier than normal.  Your speech is slurred.  You cannot recognize people or places.  You have a seizure.  It is difficult to wake you up.  You have unusual behavior changes.  You have changes in your vision.  You lose consciousness. Summary  A concussion is a brain injury that results from a hard, direct hit (trauma) to your head or body.  You may have imaging tests and neuropsychological tests to diagnose a concussion.  Treatment for this condition includes physical and mental rest and careful observation.  Ask your health care provider when you can return to  your normal activities, such as school, work, athletics, and driving.  Get help right away if you have a severe headache, weakness on one side of the body, seizures, behavior changes, changes in vision, or if you are confused or sleepier than normal. This information is not intended to replace advice given to you by your health care provider. Make sure you discuss any questions you have with your health care provider. Document Revised: 05/13/2018 Document Reviewed: 05/13/2018 Elsevier Patient Education  2020 [employer].

## 2020-07-19 ENCOUNTER — Ambulatory Visit (INDEPENDENT_AMBULATORY_CARE_PROVIDER_SITE_OTHER)
Admission: RE | Admit: 2020-07-19 | Discharge: 2020-07-19 | Disposition: A | Discharge status: Home / Self Care | Payer: BC Managed Care – PPO | Source: Ambulatory Visit | Attending: Physician Assistant | Admitting: Physician Assistant

## 2020-07-19 ENCOUNTER — Other Ambulatory Visit: Payer: Self-pay

## 2020-07-19 DIAGNOSIS — M47812 Spondylosis without myelopathy or radiculopathy, cervical region: Secondary | ICD-10-CM | POA: Diagnosis not present

## 2020-07-19 DIAGNOSIS — M542 Cervicalgia: Secondary | ICD-10-CM | POA: Diagnosis not present

## 2020-07-19 DIAGNOSIS — M546 Pain in thoracic spine: Secondary | ICD-10-CM

## 2020-07-31 DIAGNOSIS — M542 Cervicalgia: Secondary | ICD-10-CM | POA: Diagnosis not present

## 2020-07-31 DIAGNOSIS — F4322 Adjustment disorder with anxiety: Secondary | ICD-10-CM | POA: Diagnosis not present

## 2020-08-01 ENCOUNTER — Ambulatory Visit: Payer: BC Managed Care – PPO | Admitting: Family Medicine

## 2020-08-01 ENCOUNTER — Other Ambulatory Visit: Payer: Self-pay

## 2020-08-01 ENCOUNTER — Encounter: Payer: Self-pay | Admitting: Family Medicine

## 2020-08-01 VITALS — BP 110/72 | HR 74 | Ht 75.0 in | Wt 211.8 lb

## 2020-08-01 DIAGNOSIS — S060X0D Concussion without loss of consciousness, subsequent encounter: Secondary | ICD-10-CM

## 2020-08-01 DIAGNOSIS — G4486 Cervicogenic headache: Secondary | ICD-10-CM

## 2020-08-01 MED ORDER — BACLOFEN 10 MG PO TABS: 10.0000 mg | ORAL_TABLET | Freq: Every evening | ORAL | 3 refills | Status: DC | PRN

## 2020-08-01 NOTE — Patient Instructions (Addendum)
Thank you for coming in today.  Ok to proceed to MRI brain.  We will work on authorization and should call you soonish to schedule.  Ok to return to work from home Tuesday.   Continue PT.   Recheck in 1 month.   Use mostly baclofen at bedtime.
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
## 2020-08-07 DIAGNOSIS — M542 Cervicalgia: Secondary | ICD-10-CM | POA: Diagnosis not present

## 2020-08-09 DIAGNOSIS — M542 Cervicalgia: Secondary | ICD-10-CM | POA: Diagnosis not present

## 2020-08-13 DIAGNOSIS — F4322 Adjustment disorder with anxiety: Secondary | ICD-10-CM | POA: Diagnosis not present

## 2020-08-14 DIAGNOSIS — M542 Cervicalgia: Secondary | ICD-10-CM | POA: Diagnosis not present

## 2020-08-16 DIAGNOSIS — M542 Cervicalgia: Secondary | ICD-10-CM | POA: Diagnosis not present

## 2020-08-20 DIAGNOSIS — M542 Cervicalgia: Secondary | ICD-10-CM | POA: Diagnosis not present

## 2020-08-23 DIAGNOSIS — M542 Cervicalgia: Secondary | ICD-10-CM | POA: Diagnosis not present

## 2020-08-27 ENCOUNTER — Encounter: Payer: Self-pay | Admitting: Family Medicine

## 2020-08-27 DIAGNOSIS — M542 Cervicalgia: Secondary | ICD-10-CM | POA: Diagnosis not present

## 2020-08-28 ENCOUNTER — Other Ambulatory Visit: Payer: Self-pay | Admitting: *Deleted

## 2020-08-28 ENCOUNTER — Encounter: Payer: Self-pay | Admitting: Family Medicine

## 2020-08-28 ENCOUNTER — Ambulatory Visit: Admission: RE | Admit: 2020-08-28 | Payer: BC Managed Care – PPO | Source: Ambulatory Visit

## 2020-08-28 DIAGNOSIS — F4312 Post-traumatic stress disorder, chronic: Secondary | ICD-10-CM

## 2020-08-28 DIAGNOSIS — F4322 Adjustment disorder with anxiety: Secondary | ICD-10-CM | POA: Diagnosis not present

## 2020-08-29 ENCOUNTER — Other Ambulatory Visit: Payer: Self-pay

## 2020-08-29 DIAGNOSIS — M542 Cervicalgia: Secondary | ICD-10-CM | POA: Diagnosis not present

## 2020-08-29 DIAGNOSIS — F4312 Post-traumatic stress disorder, chronic: Secondary | ICD-10-CM

## 2020-09-03 ENCOUNTER — Ambulatory Visit: Payer: BC Managed Care – PPO | Admitting: Family Medicine

## 2020-09-03 ENCOUNTER — Other Ambulatory Visit: Payer: Self-pay

## 2020-09-03 ENCOUNTER — Encounter: Payer: Self-pay | Admitting: Family Medicine

## 2020-09-03 VITALS — BP 128/64 | HR 80 | Ht 75.0 in | Wt 214.0 lb

## 2020-09-03 DIAGNOSIS — S060X0D Concussion without loss of consciousness, subsequent encounter: Secondary | ICD-10-CM | POA: Diagnosis not present

## 2020-09-03 DIAGNOSIS — G4486 Cervicogenic headache: Secondary | ICD-10-CM | POA: Diagnosis not present

## 2020-09-03 MED ORDER — TRAZODONE HCL 50 MG PO TABS: 50.0000 mg | ORAL_TABLET | Freq: Every day | ORAL | 1 refills | Status: DC

## 2020-09-03 NOTE — Patient Instructions (Addendum)
Thank you for coming in today.  OK to resume exercise.   Try stopping nortriptyline. Try trazodone for insomnia.   Recheck in 2 months or so.  Let me know sooner if not better.
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
## 2020-09-06 ENCOUNTER — Emergency Department: Payer: BC Managed Care – PPO

## 2020-09-06 ENCOUNTER — Other Ambulatory Visit: Payer: Self-pay

## 2020-09-06 ENCOUNTER — Telehealth: Payer: Self-pay

## 2020-09-06 ENCOUNTER — Encounter: Payer: Self-pay | Admitting: *Deleted

## 2020-09-06 DIAGNOSIS — R079 Chest pain, unspecified: Secondary | ICD-10-CM | POA: Diagnosis not present

## 2020-09-06 DIAGNOSIS — R0602 Shortness of breath: Secondary | ICD-10-CM | POA: Insufficient documentation

## 2020-09-06 LAB — CBC
HCT: 35.9 % — ABNORMAL LOW (ref 39.0–52.0)
Hemoglobin: 12 g/dL — ABNORMAL LOW (ref 13.0–17.0)
MCH: 35.4 pg — ABNORMAL HIGH (ref 26.0–34.0)
MCHC: 33.4 g/dL (ref 30.0–36.0)
MCV: 105.9 fL — ABNORMAL HIGH (ref 80.0–100.0)
Platelets: 153 10*3/uL (ref 150–400)
RBC: 3.39 MIL/uL — ABNORMAL LOW (ref 4.22–5.81)
RDW: 12.3 % (ref 11.5–15.5)
WBC: 7.3 10*3/uL (ref 4.0–10.5)
nRBC: 0 % (ref 0.0–0.2)

## 2020-09-06 LAB — BASIC METABOLIC PANEL
Anion gap: 6 (ref 5–15)
BUN: 13 mg/dL (ref 6–20)
CO2: 25 mmol/L (ref 22–32)
Calcium: 9.8 mg/dL (ref 8.9–10.3)
Chloride: 108 mmol/L (ref 98–111)
Creatinine, Ser: 1.02 mg/dL (ref 0.61–1.24)
GFR, Estimated: >60 mL/min (ref >60)
Glucose, Bld: 93 mg/dL (ref 70–99)
Potassium: 3.6 mmol/L (ref 3.5–5.1)
Sodium: 139 mmol/L (ref 135–145)

## 2020-09-06 LAB — TROPONIN I (HIGH SENSITIVITY): Troponin I (High Sensitivity): 6 ng/L (ref <18)

## 2020-09-06 NOTE — Telephone Encounter (Signed)
Nurse Assessment Nurse: Yetta Barre, RN, Miranda Date/Time (Eastern Time): 09/06/2020 9:03:19 AM Confirm and document reason for call. If symptomatic, describe symptoms. ---Caller states he is having pain in his throat, headache, body aches, and chest pain. The chest pain is worse when he breaths. He did get his 3rd COVID shot yesterday. Does the patient have any new or worsening symptoms? ---Yes Will a triage be completed? ---Yes Related visit to physician within the last 2 weeks? ---No Does the PT have any chronic conditions? (i.e. diabetes, asthma, this includes High risk factors for pregnancy, etc.) ---Yes List chronic conditions. ---Anxiety Is this a behavioral health or substance abuse call? ---No Guidelines Guideline Title Affirmed Question Affirmed Notes Nurse Date/Time (Eastern Time) COVID-19 - Vaccine Questions and Reactions Sounds like a severe, unusual reaction to the triager Yetta Barre, RN, Miranda 09/06/2020 9:05:41 AM Disp. Time Scott Watts Time) Disposition Final User 09/06/2020 9:02:21 AM Send to Urgent Queue Richrd Humbles 09/06/2020 9:09:30 AM Go to ED Now (or PCP triage) Yes Yetta Barre, RN, Miranda PLEASE NOTE: All timestamps contained within this report are represented as [place] Standard Time. CONFIDENTIALTY NOTICE: This fax transmission is intended only for the addressee. It contains information that is legally privileged, confidential or otherwise protected from use or disclosure. If you are not the intended recipient, you are strictly prohibited from reviewing, disclosing, copying using or disseminating any of this information or taking any action in reliance on or regarding this information. If you have received this fax in error, please notify us immediately by telephone so that we can arrange for its return to [place]. Phone: [phone], Toll-Free: [phone], Fax: [phone] Page: 2 of 2 Call Id: [id number] Caller Disagree/Comply Comply Caller Understands  Yes PreDisposition Call Doctor Care Advice Given Per Guideline GO TO ED NOW (OR PCP TRIAGE): * IF NO PCP (PRIMARY CARE PROVIDER) SECOND-LEVEL TRIAGE: You need to be seen within the next hour. Go to the ED/UCC at _____________ Hospital. Leave as soon as you can. CARE ADVICE given per COVID-19 - Vaccine Questions and Reactions (Adult) guideline. Referrals GO TO FACILITY UNDECIDE

## 2020-09-06 NOTE — ED Triage Notes (Signed)
Pt ambulatory to triage.  Pt had his pfizer covid booster yesterday.  Pt started having chest pain and sob last night.  No n/v/d  Pt reports belching a lot.  Pt took tums without relief.  Pt alert  Speech clear.

## 2020-09-07 ENCOUNTER — Emergency Department
Admission: EM | Admit: 2020-09-07 | Discharge: 2020-09-07 | Disposition: A | Discharge status: Home / Self Care | Payer: BC Managed Care – PPO | Attending: Emergency Medicine | Admitting: Emergency Medicine

## 2020-09-07 ENCOUNTER — Telehealth: Payer: Self-pay

## 2020-09-07 NOTE — Addendum Note (Signed)
Addended by: Lieutenant Diego A on: 09/07/2020 03:30 PM   Modules accepted: Orders

## 2020-09-07 NOTE — Telephone Encounter (Signed)
Schedule virtual for pt.  

## 2020-09-07 NOTE — Telephone Encounter (Signed)
You can order under macrocytic anemia.  Katina Degree. [name], MD 09/07/2020 4:01 PM

## 2020-09-07 NOTE — Telephone Encounter (Signed)
See below

## 2020-09-07 NOTE — Telephone Encounter (Signed)
Blood counts dropped but everything else looks normal. Would like for him to come in to check CBC and B12.  Katina Degree. [name], MD 09/07/2020 1:32 PM

## 2020-09-07 NOTE — Telephone Encounter (Signed)
Called and lm on pt vm with below message and for pt tcb to schedule lab visit. Labs ordered.

## 2020-09-07 NOTE — Telephone Encounter (Signed)
LVM asking patient to call back to get scheduled. 

## 2020-09-07 NOTE — Telephone Encounter (Signed)
Pt went to ED yesterday for chest pain. It took so long that pt left, but did get tests done. He has asking Dr. [name] to look at those tests.

## 2020-09-10 NOTE — Telephone Encounter (Signed)
LVM to patient, give [place] a call to schedule appointment with lab

## 2020-09-11 ENCOUNTER — Other Ambulatory Visit: Payer: Self-pay

## 2020-09-11 ENCOUNTER — Other Ambulatory Visit: Payer: BC Managed Care – PPO

## 2020-09-11 ENCOUNTER — Other Ambulatory Visit: Payer: Self-pay | Admitting: *Deleted

## 2020-09-11 DIAGNOSIS — F4322 Adjustment disorder with anxiety: Secondary | ICD-10-CM | POA: Diagnosis not present

## 2020-09-11 DIAGNOSIS — D539 Nutritional anemia, unspecified: Secondary | ICD-10-CM

## 2020-09-12 ENCOUNTER — Encounter: Payer: Self-pay | Admitting: Family Medicine

## 2020-09-12 ENCOUNTER — Other Ambulatory Visit: Payer: Self-pay | Admitting: *Deleted

## 2020-09-12 DIAGNOSIS — M542 Cervicalgia: Secondary | ICD-10-CM | POA: Diagnosis not present

## 2020-09-12 DIAGNOSIS — D539 Nutritional anemia, unspecified: Secondary | ICD-10-CM

## 2020-09-12 LAB — CBC
HCT: 36.3 % — ABNORMAL LOW (ref 38.5–50.0)
Hemoglobin: 12.4 g/dL — ABNORMAL LOW (ref 13.2–17.1)
MCH: 35.1 pg — ABNORMAL HIGH (ref 27.0–33.0)
MCHC: 34.2 g/dL (ref 32.0–36.0)
MCV: 102.8 fL — ABNORMAL HIGH (ref 80.0–100.0)
MPV: 10.7 fL (ref 7.5–12.5)
Platelets: 162 10*3/uL (ref 140–400)
RBC: 3.53 10*6/uL — ABNORMAL LOW (ref 4.20–5.80)
RDW: 11.2 % (ref 11.0–15.0)
WBC: 5.9 10*3/uL (ref 3.8–10.8)

## 2020-09-12 LAB — VITAMIN B12: Vitamin B-12: 236 pg/mL (ref 200–1100)

## 2020-09-12 NOTE — Telephone Encounter (Signed)
See lab results note.

## 2020-09-12 NOTE — Progress Notes (Signed)
Please inform patient of the following:  B12 level borderline low. Would like for him to start b12 protocol. I would also like for him to check FOBT to make sure he is not having any internal bleeding.  Katina Degree. [name], MD 09/12/2020 8:53 AM

## 2020-09-13 ENCOUNTER — Encounter: Payer: Self-pay | Admitting: Family Medicine

## 2020-09-14 ENCOUNTER — Other Ambulatory Visit: Payer: Self-pay | Admitting: *Deleted

## 2020-09-14 DIAGNOSIS — M542 Cervicalgia: Secondary | ICD-10-CM | POA: Diagnosis not present

## 2020-09-14 MED ORDER — B-12 COMPLIANCE INJECTION 1000 MCG/ML IJ KIT: PACK | INTRAMUSCULAR | 3 refills | Status: DC

## 2020-09-18 DIAGNOSIS — M542 Cervicalgia: Secondary | ICD-10-CM | POA: Diagnosis not present

## 2020-09-19 ENCOUNTER — Other Ambulatory Visit (INDEPENDENT_AMBULATORY_CARE_PROVIDER_SITE_OTHER): Payer: BC Managed Care – PPO

## 2020-09-19 DIAGNOSIS — D539 Nutritional anemia, unspecified: Secondary | ICD-10-CM

## 2020-09-19 LAB — FECAL OCCULT BLOOD, IMMUNOCHEMICAL: Fecal Occult Bld: NEGATIVE

## 2020-09-19 NOTE — Progress Notes (Signed)
Please inform patient of the following:  FOBT negative. He does not have any internal bleeding. His anemia is probably due to his low b12 and should improve now that he is back on b12. We can recheck his blood work in 3-6 months.  Katina Degree. [name], MD 09/19/2020 9:07 AM

## 2020-09-20 DIAGNOSIS — M542 Cervicalgia: Secondary | ICD-10-CM | POA: Diagnosis not present

## 2020-09-24 NOTE — Telephone Encounter (Signed)
Spoke with patient and emailed completed ppwk.

## 2020-09-25 DIAGNOSIS — M542 Cervicalgia: Secondary | ICD-10-CM | POA: Diagnosis not present

## 2020-10-12 ENCOUNTER — Encounter: Payer: Self-pay | Admitting: Family Medicine

## 2020-10-12 ENCOUNTER — Other Ambulatory Visit: Payer: Self-pay | Admitting: *Deleted

## 2020-10-12 DIAGNOSIS — E538 Deficiency of other specified B group vitamins: Secondary | ICD-10-CM

## 2020-10-12 DIAGNOSIS — M542 Cervicalgia: Secondary | ICD-10-CM | POA: Diagnosis not present

## 2020-10-12 MED ORDER — B-12 COMPLIANCE INJECTION 1000 MCG/ML IJ KIT: PACK | INTRAMUSCULAR | 3 refills | Status: DC

## 2020-10-16 DIAGNOSIS — M542 Cervicalgia: Secondary | ICD-10-CM | POA: Diagnosis not present

## 2020-10-23 DIAGNOSIS — M542 Cervicalgia: Secondary | ICD-10-CM | POA: Diagnosis not present

## 2020-10-30 DIAGNOSIS — M542 Cervicalgia: Secondary | ICD-10-CM | POA: Diagnosis not present
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
## 2020-11-05 ENCOUNTER — Ambulatory Visit: Payer: BC Managed Care – PPO | Admitting: Family Medicine

## 2020-11-05 ENCOUNTER — Other Ambulatory Visit: Payer: Self-pay

## 2020-11-05 VITALS — BP 128/78 | HR 71 | Ht 75.0 in | Wt 218.6 lb

## 2020-11-05 DIAGNOSIS — S060X0D Concussion without loss of consciousness, subsequent encounter: Secondary | ICD-10-CM

## 2020-11-05 DIAGNOSIS — G4486 Cervicogenic headache: Secondary | ICD-10-CM

## 2020-11-05 MED ORDER — HYDROXYZINE HCL 25 MG PO TABS: 25.0000 mg | ORAL_TABLET | Freq: Every evening | ORAL | 3 refills | Status: DC | PRN

## 2020-11-05 NOTE — Patient Instructions (Signed)
Thank you for coming in today. Recheck with me as needed.   

## 2021-01-04 ENCOUNTER — Encounter: Payer: Self-pay | Admitting: Family Medicine

## 2021-01-04 MED ORDER — CLONAZEPAM 1 MG PO TABS: 1.0000 mg | ORAL_TABLET | Freq: Two times a day (BID) | ORAL | 1 refills | Status: DC | PRN

## 2021-01-04 NOTE — Telephone Encounter (Signed)
See note

## 2021-01-11 IMAGING — DX DG FINGER RING 2+V*R*
3 series · 3 of 3 positions shown · non-contrast
Comparison: 04/04/2020

CLINICAL DATA: Fourth digit distal phalanx fracture

EXAM:
RIGHT RING FINGER 2+V

[finger ap]
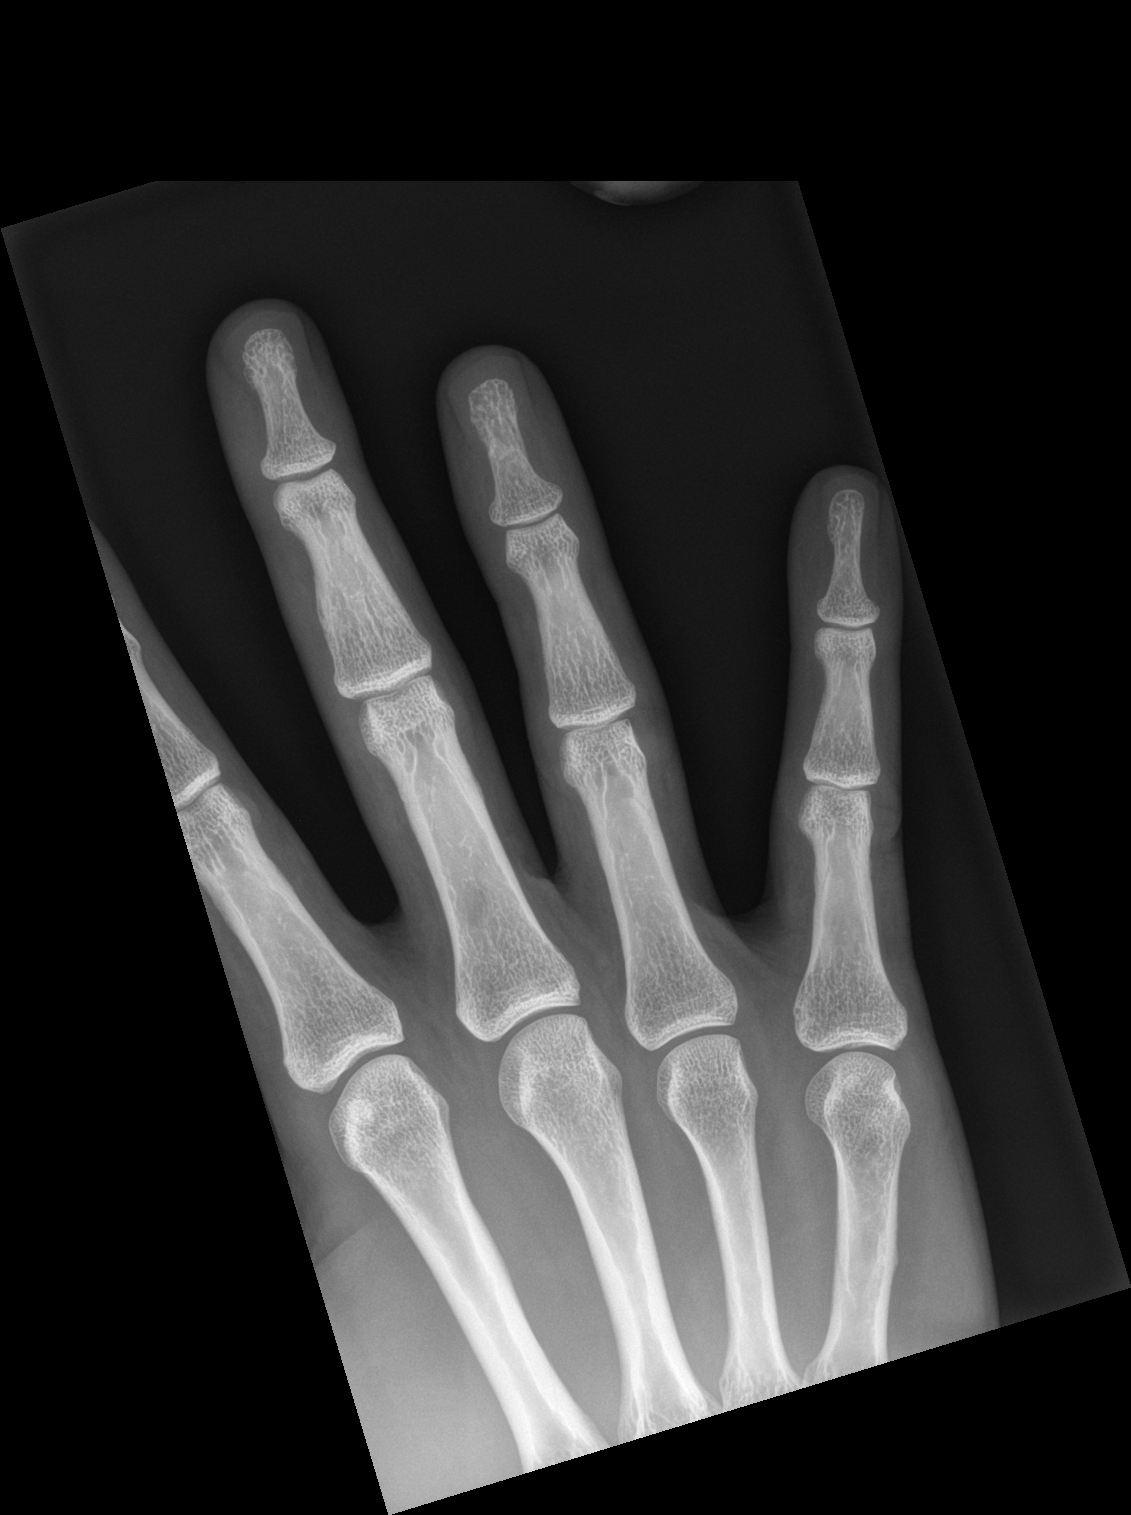

[finger obl]
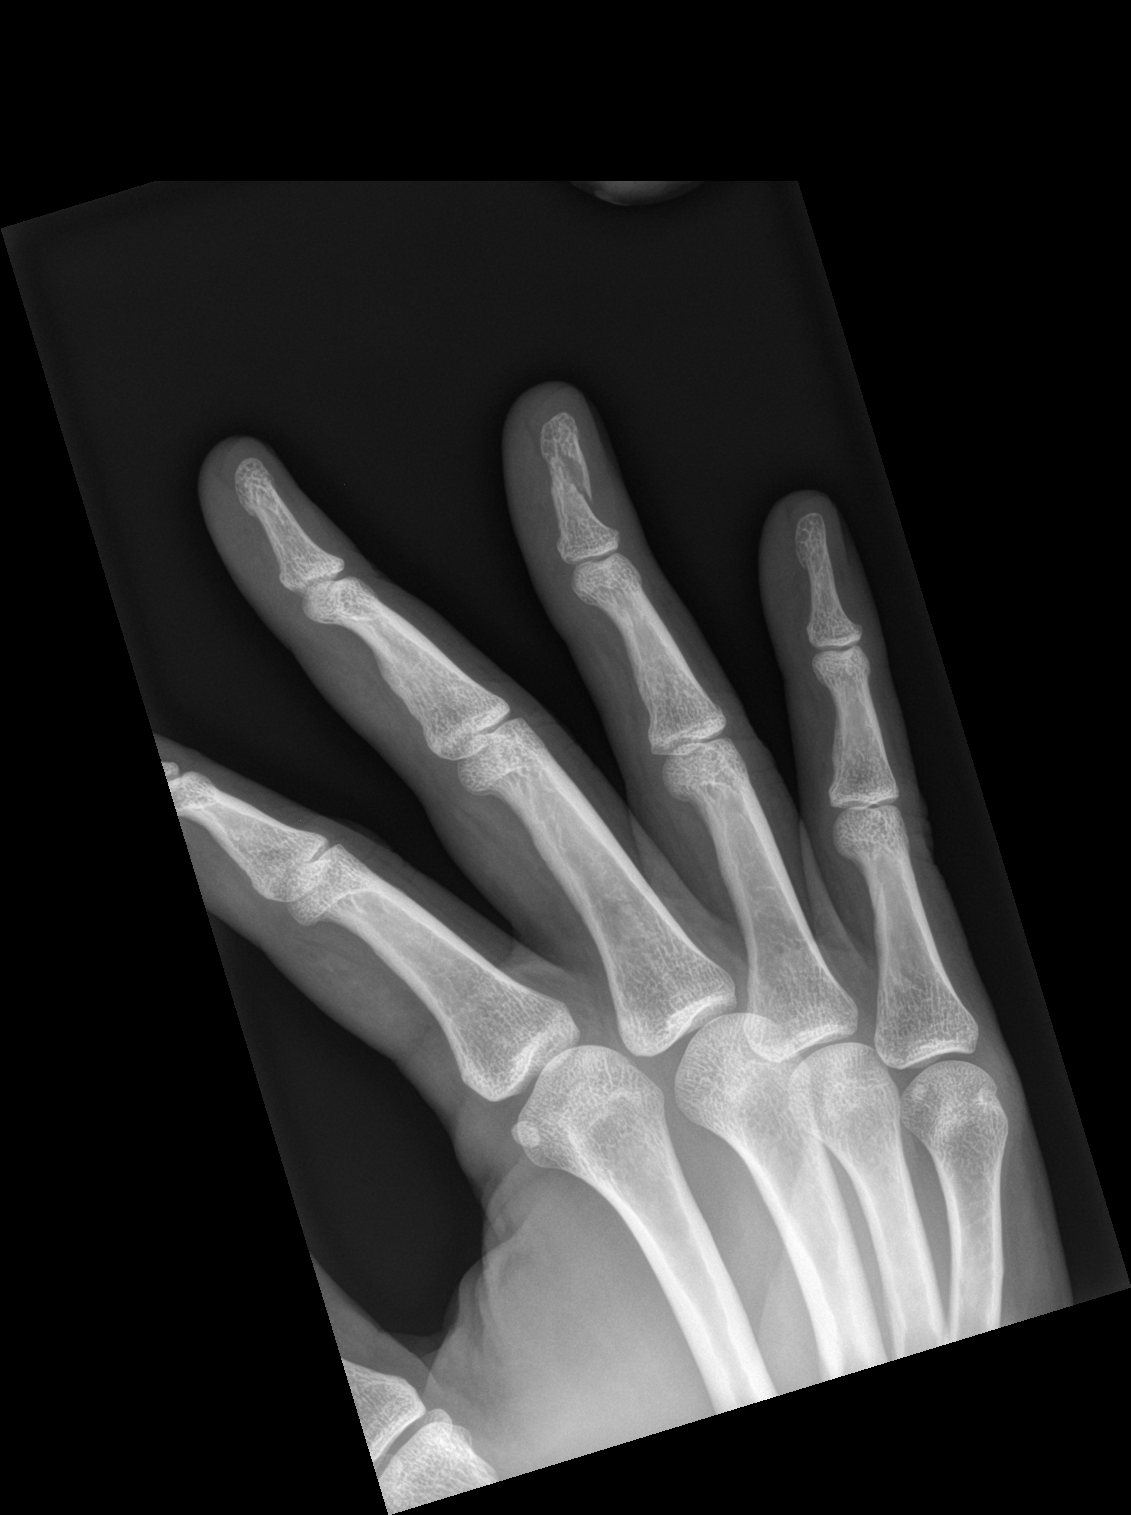

[finger lat]
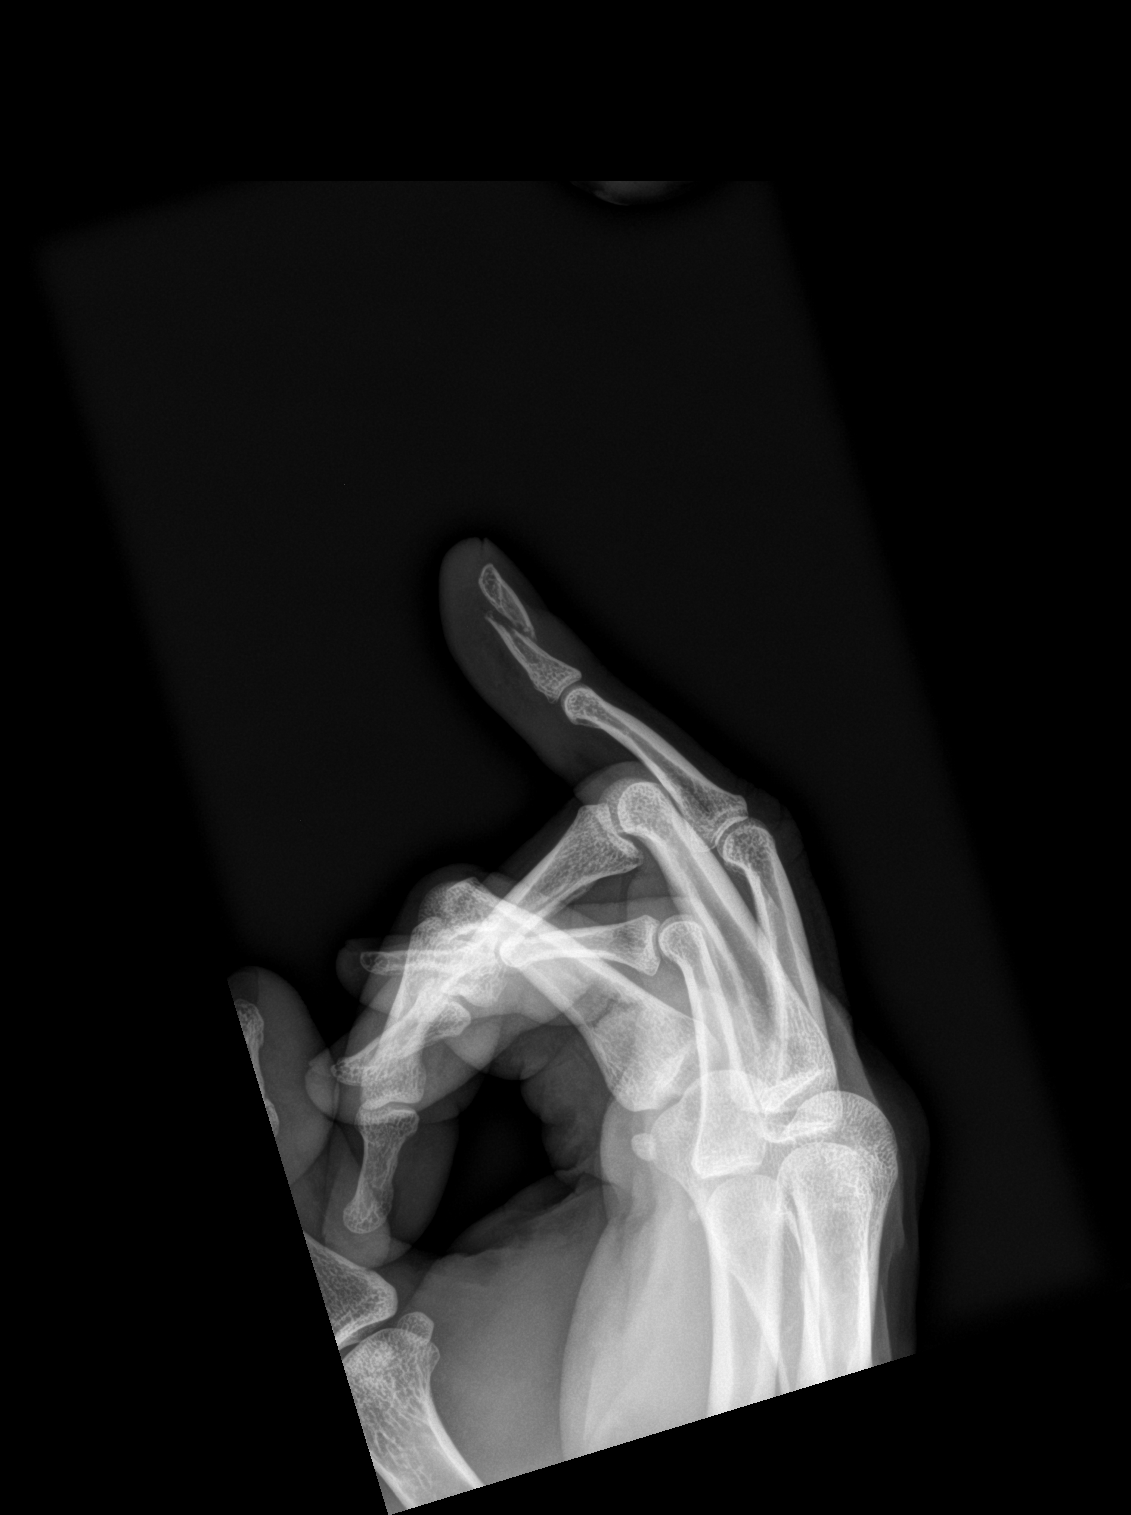

[3 of 3 positions shown; findings below may reference images not displayed]

FINDINGS: Similar appearance of the minimally displaced oblique fracture of
the fourth digit distal phalanx. No significant callus formation at
this point. Soft tissues unremarkable. Fracture does not involve the
articular surface. No associated subluxation or dislocation.
IMPRESSION: Stable oblique fracture with minimal displacement of the fourth
digit distal phalanx.

## 2021-01-29 ENCOUNTER — Other Ambulatory Visit: Payer: Self-pay | Admitting: *Deleted

## 2021-01-29 ENCOUNTER — Encounter: Payer: Self-pay | Admitting: Family Medicine

## 2021-01-29 MED ORDER — BUSPIRONE HCL 10 MG PO TABS
10.0000 mg | ORAL_TABLET | Freq: Two times a day (BID) | ORAL | 5 refills | Status: DC
Start: 2021-01-29 — End: 2021-08-22

## 2021-01-31 ENCOUNTER — Other Ambulatory Visit: Payer: Self-pay

## 2021-01-31 ENCOUNTER — Ambulatory Visit: Payer: BC Managed Care – PPO | Admitting: Family Medicine

## 2021-01-31 ENCOUNTER — Ambulatory Visit: Payer: Self-pay

## 2021-01-31 VITALS — BP 146/86 | HR 82 | Ht 75.0 in | Wt 218.6 lb

## 2021-01-31 DIAGNOSIS — M25561 Pain in right knee: Secondary | ICD-10-CM | POA: Diagnosis not present

## 2021-01-31 NOTE — Progress Notes (Signed)
   I, Philbert Riser, LAT, ATC acting as a scribe for [name], MD.  Scott Watts is a 40 y.o. male who presents to [facility] today for R knee pain ongoing since the end of March w/ no known MOI. Pt was last seen by Dr. [name] on 11/05/20 for a concussion. Today, pt locates knee pain to the anterior aspect of knee, distal to patella. 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  R knee swelling: yes Mechanical symptoms: yes Aggravates: deep squatting Treatments tried: ice   Pertinent review of systems: No fevers or chills  Relevant historical information: History of sciatica   Exam:  BP (!) 146/86 (BP Location: Right Arm, Patient Position: Sitting, Cuff Size: Normal)   Pulse 82   Ht 6\' 3"  (1.905 m)   Wt 218 lb 9.6 oz (99.2 kg)   SpO2 97%   BMI 27.32 kg/m  General: Well Developed, well nourished, and in no acute distress.   MSK: Right knee normal-appearing Normal motion with minimal crepitation. Mildly tender palpation overlying patella and patellar tendon with palpable squeak. Stable ligamentous exam. Intact strength. Negative McMurray's test.    Lab and Radiology Results  Diagnostic Limited MSK Ultrasound of: Right knee Quad tendon intact normal-appearing Trace joint effusion superior patellar space. Patellar tendon is intact. Trace hypoechoic fluid tracks within the soft tissues superficial to tendon. Small amount of hypoechoic fluid tracks deep to tendon at distal tendon insertion onto tibia Lateral joint line normal-appearing Medial joint line small change within medial meniscus concerning for degenerative tear. Posterior knee no Baker's cyst. Impression: Prepatellar and infrapatellar bursitis   Assessment and Plan: 40 y.o. male with right knee pain predominantly due to patellofemoral pain syndrome with some evidence of prepatellar and infrapatellar bursitis.  Patient is a great  candidate for home exercise program and Voltaren gel.  If not improving on his own in a few weeks he will let me know and I will refer to physical therapy where he is done well previously for other issues.  Plan to check back with me in about a month.  Neck step if not improved would be physical therapy, injection, or even ultimately MRI.  Definitely would like to get an x-ray at follow-up check as well if needed.   PDMP not reviewed this encounter. Orders Placed This Encounter  Procedures  . 40 LIMITED JOINT SPACE STRUCTURES LOW RIGHT(NO LINKED CHARGES)    Standing Status:   Future    Number of Occurrences:   1    Standing Expiration Date:   08/02/2021    Order Specific Question:   Reason for Exam (SYMPTOM  OR DIAGNOSIS REQUIRED)    Answer:   right knee pain    Order Specific Question:   Preferred imaging location?    Answer:   [facility]   No orders of the defined types were placed in this encounter.    Discussed warning signs or symptoms. Please see discharge instructions. Patient expresses understanding.   The above documentation has been reviewed and is accurate and complete 08/04/2021, M.D.

## 2021-01-31 NOTE — Patient Instructions (Addendum)
Thank you for coming in today.  I think this is patellofemoral pain syndrome + chondromalacia patella.   Please use Voltaren gel (Generic Diclofenac Gel) up to 4x daily for pain as needed.  This is available over-the-counter as both the name brand Voltaren gel and the generic diclofenac gel.  Do the exercises we reviewed. View at [email or web address] using code: [id number]  Recheck in 1 month.   If not better next steps are injection and xray + PT.

## 2021-02-15 ENCOUNTER — Ambulatory Visit: Payer: BC Managed Care – PPO | Admitting: Family Medicine

## 2021-02-18 ENCOUNTER — Ambulatory Visit (INDEPENDENT_AMBULATORY_CARE_PROVIDER_SITE_OTHER): Payer: BC Managed Care – PPO | Admitting: Family Medicine

## 2021-02-18 ENCOUNTER — Encounter: Payer: Self-pay | Admitting: Family Medicine

## 2021-02-18 ENCOUNTER — Other Ambulatory Visit: Payer: Self-pay

## 2021-02-18 VITALS — BP 126/80 | HR 71 | Temp 98.3°F | Ht 75.0 in | Wt 217.4 lb

## 2021-02-18 DIAGNOSIS — E538 Deficiency of other specified B group vitamins: Secondary | ICD-10-CM | POA: Diagnosis not present

## 2021-02-18 DIAGNOSIS — Z0001 Encounter for general adult medical examination with abnormal findings: Secondary | ICD-10-CM | POA: Diagnosis not present

## 2021-02-18 DIAGNOSIS — F419 Anxiety disorder, unspecified: Secondary | ICD-10-CM

## 2021-02-18 DIAGNOSIS — Z125 Encounter for screening for malignant neoplasm of prostate: Secondary | ICD-10-CM

## 2021-02-18 DIAGNOSIS — Z13220 Encounter for screening for lipoid disorders: Secondary | ICD-10-CM

## 2021-02-18 DIAGNOSIS — N529 Male erectile dysfunction, unspecified: Secondary | ICD-10-CM | POA: Diagnosis not present

## 2021-02-18 DIAGNOSIS — R059 Cough, unspecified: Secondary | ICD-10-CM

## 2021-02-18 DIAGNOSIS — B079 Viral wart, unspecified: Secondary | ICD-10-CM

## 2021-02-18 MED ORDER — TADALAFIL 20 MG PO TABS: 20.0000 mg | ORAL_TABLET | Freq: Every day | ORAL | 5 refills | Status: DC | PRN

## 2021-02-18 MED ORDER — CLONAZEPAM 1 MG PO TABS: 0.5000 mg | ORAL_TABLET | Freq: Two times a day (BID) | ORAL | 5 refills | Status: DC | PRN

## 2021-02-18 NOTE — Patient Instructions (Signed)
It was very nice to see you today!  We will check blood work today.  We froze the wart on your finger.  Please let me know if this needs to be frozen again in the next few weeks.  I will refill your medications today.  Take care, Dr [name]  PLEASE NOTE:  If you had any lab tests please let us know if you have not heard back within a few days. You may see your results on mychart before we have a chance to review them but we will give you a call once they are reviewed by [place]. If we ordered any referrals today, please let us know if you have not heard from their office within the next week.   Please try these tips to maintain a healthy lifestyle:   Eat at least 3 REAL meals and 1-2 snacks per day.  Aim for no more than 5 hours between eating.  If you eat breakfast, please do so within one hour of getting up.    Each meal should contain half fruits/vegetables, one quarter protein, and one quarter carbs (no bigger than a computer mouse)   Cut down on sweet beverages. This includes juice, soda, and sweet tea.     Drink at least 1 glass of water with each meal and aim for at least 8 glasses per day   Exercise at least 150 minutes every week.

## 2021-02-18 NOTE — Assessment & Plan Note (Signed)
Stable on tadalafil 20mg daily as needed.  

## 2021-02-18 NOTE — Assessment & Plan Note (Signed)
Check B12 

## 2021-02-18 NOTE — Progress Notes (Signed)
Chief Complaint:  Scott Watts is a 40 y.o. male who presents today for his annual comprehensive physical exam.    Assessment/Plan:  New/Acute Problems: Finger pain Sequela of fracture last year.  He saw orthopedics recently.  He has been using Voltaren gel the last few weeks which seems to be helping.  Briefly discussed wavetechTherapy.  Discussed with patient how much evidence for benefit from have some improvement.  Tresiba effect.  Cutaneous wart Cryotherapy applied today via cotton tip.  He tolerated well.  See below procedure note.  Chronic Problems Addressed Today: B12 deficiency Check B12.  Erectile dysfunction Stable on tadalafil 20 mg daily as needed.  Anxiety Has been working with therapy which seems to be going very well.  He is on BuSpar 10 mg twice daily and Klonopin 0.5 to 1 mg twice daily as needed.   Preventative Healthcare: Check labs.  Patient Counseling(The following topics were reviewed and/or handout was given):  -Nutrition: Stressed importance of moderation in sodium/caffeine intake, saturated fat and cholesterol, caloric balance, sufficient intake of fresh fruits, vegetables, and fiber.  -Stressed the importance of regular exercise.   -Substance Abuse: Discussed cessation/primary prevention of tobacco, alcohol, or other drug use; driving or other dangerous activities under the influence; availability of treatment for abuse.   -Injury prevention: Discussed safety belts, safety helmets, smoke detector, smoking near bedding or upholstery.   -Sexuality: Discussed sexually transmitted diseases, partner selection, use of condoms, avoidance of unintended pregnancy and contraceptive alternatives.   -Dental health: Discussed importance of regular tooth brushing, flossing, and dental visits.  -Health maintenance and immunizations reviewed. Please refer to Health maintenance section.  Return to care in 1 year for next preventative visit.     Subjective:   HPI:  He has no acute complaints today.  He has a wart that he would like to have looked at today.  Still has persistent right ring finger pain after working about a year ago.  Lifestyle Diet: Balanced. Plenty of fruits and vegetables.  Exercise: Limited.   Depression screen [facility] 2/9 04/03/2020  Decreased Interest 2  Down, Depressed, Hopeless 0  PHQ - 2 Score 2  Altered sleeping 3  Tired, decreased energy 3  Change in appetite 0  Feeling bad or failure about yourself  0  Trouble concentrating 2  Moving slowly or fidgety/restless 0  Suicidal thoughts 0  PHQ-9 Score 10  Difficult doing work/chores Not difficult at all    Health Maintenance Due  Topic Date Due  . HIV Screening  Never done     ROS: Per HPI, otherwise a complete review of systems was negative.   PMH:  The following were reviewed and entered/updated in epic: Past Medical History:  Diagnosis Date  . Low serum vitamin B12    Patient Active Problem List   Diagnosis Date Noted  . B12 deficiency 02/18/2021  . Low HDL (under 40) 11/12/2019  . Androgenic alopecia 03/01/2019  . Condyloma 03/01/2019  . Insomnia 01/24/2019  . Chronic right-sided low back pain with right-sided sciatica 10/26/2018  . Anxiety 08/27/2018  . Erectile dysfunction 08/27/2018  . Skin lesion 08/27/2018  . Nicotine dependence with current use 08/27/2018   Past Surgical History:  Procedure Laterality Date  . COLONOSCOPY  2014  . CONDYLOMA EXCISION/FULGURATION      Family History  Problem Relation Age of Onset  . Cerebral aneurysm Mother   . Alcohol abuse Father   . Cancer Neg Hx     Medications- reviewed and updated  Current Outpatient Medications  Medication Sig Dispense Refill  . baclofen (LIORESAL) 10 MG tablet Take 1 tablet (10 mg total) by mouth at bedtime as needed for muscle spasms (and sleep). 30 each 3  . busPIRone (BUSPAR) 10 MG tablet Take 1 tablet (10 mg total) by mouth 2 (two) times daily. 60 tablet 5  .  clonazePAM (KLONOPIN) 1 MG tablet Take 0.5-1 tablets (0.5-1 mg total) by mouth 2 (two) times daily as needed for anxiety. 30 tablet 5  . Cyanocobalamin (B-12 COMPLIANCE INJECTION) 1000 MCG/ML KIT 1065mg monthly for 3 month 1 kit 3  . diclofenac (VOLTAREN) 75 MG EC tablet Take 1 tablet (75 mg total) by mouth 2 (two) times daily. 30 tablet 0  . hydrOXYzine (ATARAX/VISTARIL) 25 MG tablet Take 1-2 tablets (25-50 mg total) by mouth at bedtime as needed (insomnia). 90 tablet 3  . tadalafil (CIALIS) 20 MG tablet Take 1 tablet (20 mg total) by mouth daily as needed for erectile dysfunction. 30 tablet 5  . traZODone (DESYREL) 50 MG tablet Take 1-2 tablets (50-100 mg total) by mouth at bedtime. 60 tablet 1   No current facility-administered medications for this visit.    Allergies-reviewed and updated No Known Allergies  Social History   Socioeconomic History  . Marital status: Single    Spouse name: Not on file  . Number of children: Not on file  . Years of education: Not on file  . Highest education level: Not on file  Occupational History  . Not on file  Tobacco Use  . Smoking status: Former Smoker    Packs/day: 0.50    Types: Cigarettes    Quit date: 02/28/2020    Years since quitting: 0.9  . Smokeless tobacco: Never Used  Vaping Use  . Vaping Use: Never used  Substance and Sexual Activity  . Alcohol use: Yes    Alcohol/week: 2.0 standard drinks    Types: 2 Cans of beer per week  . Drug use: Never  . Sexual activity: Yes  Other Topics Concern  . Not on file  Social History Narrative  . Not on file   Social Determinants of Health   Financial Resource Strain: Not on file  Food Insecurity: Not on file  Transportation Needs: Not on file  Physical Activity: Not on file  Stress: Not on file  Social Connections: Not on file        Objective:  Physical Exam: BP 126/80   Pulse 71   Temp 98.3 F (36.8 C) (Temporal)   Ht '6\' 3"'  (1.905 m)   Wt 217 lb 6.4 oz (98.6 kg)   SpO2  94%   BMI 27.17 kg/m   Body mass index is 27.17 kg/m. Wt Readings from Last 3 Encounters:  02/18/21 217 lb 6.4 oz (98.6 kg)  01/31/21 218 lb 9.6 oz (99.2 kg)  11/05/20 218 lb 9.6 oz (99.2 kg)   Gen: NAD, resting comfortably HEENT: TMs normal bilaterally. OP clear. No thyromegaly noted.  CV: RRR with no murmurs appreciated Pulm: NWOB, CTAB with no crackles, wheezes, or rhonchi GI: Normal bowel sounds present. Soft, Nontender, Nondistended. MSK: no edema, cyanosis, or clubbing noted Skin: warm, dry.  Cutaneous wart on left index finger. Neuro: CN2-12 grossly intact. Strength 5/5 in upper and lower extremities. Reflexes symmetric and intact bilaterally.  Psych: Normal affect and thought content  Cryotherapy Procedure Note  Pre-operative Diagnosis: Cutaneous wart  Locations: Index finger  Indications: Therapeutic  Procedure Details  Patient informed of risks (permanent scarring,  infection, light or dark discoloration, bleeding, infection, weakness, numbness and recurrence of the lesion) and benefits of the procedure and verbal informed consent obtained.  The areas are treated with liquid nitrogen therapy, frozen until ice ball extended 3 mm beyond lesion, allowed to thaw, and treated again. The patient tolerated procedure well.  The patient was instructed on post-op care, warned that there may be blister formation, redness and pain. Recommend OTC analgesia as needed for pain.  Condition: Stable  Complications: none.       Algis Greenhouse. Jerline Pain, MD 02/18/2021 2:58 PM

## 2021-02-18 NOTE — Assessment & Plan Note (Signed)
Has been working with therapy which seems to be going very well.  He is on BuSpar 10 mg twice daily and Klonopin 0.5 to 1 mg twice daily as needed.

## 2021-02-19 ENCOUNTER — Other Ambulatory Visit: Payer: Self-pay | Admitting: *Deleted

## 2021-02-19 LAB — CBC
HCT: 41.3 % (ref 39.0–52.0)
Hemoglobin: 14 g/dL (ref 13.0–17.0)
MCHC: 33.8 g/dL (ref 30.0–36.0)
MCV: 103.2 fl — ABNORMAL HIGH (ref 78.0–100.0)
Platelets: 151 10*3/uL (ref 150.0–400.0)
RBC: 4 Mil/uL — ABNORMAL LOW (ref 4.22–5.81)
RDW: 12.6 % (ref 11.5–15.5)
WBC: 6.8 10*3/uL (ref 4.0–10.5)

## 2021-02-19 LAB — LIPID PANEL
Cholesterol: 143 mg/dL (ref 0–200)
HDL: 28.3 mg/dL — ABNORMAL LOW (ref >39.00)
LDL Cholesterol: 81 mg/dL (ref 0–99)
NonHDL: 114.32
Total CHOL/HDL Ratio: 5
Triglycerides: 169 mg/dL — ABNORMAL HIGH (ref 0.0–149.0)
VLDL: 33.8 mg/dL (ref 0.0–40.0)

## 2021-02-19 LAB — COMPREHENSIVE METABOLIC PANEL
ALT: 15 U/L (ref 0–53)
AST: 14 U/L (ref 0–37)
Albumin: 4.9 g/dL (ref 3.5–5.2)
Alkaline Phosphatase: 59 U/L (ref 39–117)
BUN: 16 mg/dL (ref 6–23)
CO2: 24 mEq/L (ref 19–32)
Calcium: 9.5 mg/dL (ref 8.4–10.5)
Chloride: 107 mEq/L (ref 96–112)
Creatinine, Ser: 0.97 mg/dL (ref 0.40–1.50)
GFR: 97.48 mL/min (ref >60.00)
Glucose, Bld: 91 mg/dL (ref 70–99)
Potassium: 4.2 mEq/L (ref 3.5–5.1)
Sodium: 139 mEq/L (ref 135–145)
Total Bilirubin: 2.8 mg/dL — ABNORMAL HIGH (ref 0.2–1.2)
Total Protein: 6.6 g/dL (ref 6.0–8.3)

## 2021-02-19 LAB — TSH: TSH: 1.19 u[IU]/mL (ref 0.35–4.50)

## 2021-02-19 LAB — VITAMIN B12: Vitamin B-12: 547 pg/mL (ref 211–911)

## 2021-02-19 LAB — PSA: PSA: 1.16 ng/mL (ref 0.10–4.00)

## 2021-02-19 NOTE — Progress Notes (Signed)
Please inform patient of the following:  Bilirubin levels are elevated but everything else is normal.  I would like for him to come back to recheck bilirubin levels.  Please place future order for fractionated bilirubin level, peripheral smear, and haptoglobin.

## 2021-02-20 ENCOUNTER — Other Ambulatory Visit: Payer: Self-pay | Admitting: *Deleted

## 2021-02-20 ENCOUNTER — Encounter: Payer: Self-pay | Admitting: Family Medicine

## 2021-02-20 DIAGNOSIS — R17 Unspecified jaundice: Secondary | ICD-10-CM

## 2021-02-20 NOTE — Telephone Encounter (Signed)
Please advise 

## 2021-02-25 ENCOUNTER — Other Ambulatory Visit: Payer: Self-pay

## 2021-02-25 ENCOUNTER — Other Ambulatory Visit: Payer: Self-pay | Admitting: *Deleted

## 2021-02-25 ENCOUNTER — Other Ambulatory Visit: Payer: BC Managed Care – PPO

## 2021-02-25 DIAGNOSIS — R17 Unspecified jaundice: Secondary | ICD-10-CM

## 2021-02-26 ENCOUNTER — Other Ambulatory Visit: Payer: Self-pay | Admitting: *Deleted

## 2021-02-26 DIAGNOSIS — D581 Hereditary elliptocytosis: Secondary | ICD-10-CM

## 2021-02-26 DIAGNOSIS — D589 Hereditary hemolytic anemia, unspecified: Secondary | ICD-10-CM

## 2021-02-26 LAB — BILIRUBIN, FRACTIONATED(TOT/DIR/INDIR)
Bilirubin, Direct: 0.4 mg/dL — ABNORMAL HIGH (ref 0.0–0.2)
Indirect Bilirubin: 1.7 mg/dL (calc) — ABNORMAL HIGH (ref 0.2–1.2)
Total Bilirubin: 2.1 mg/dL — ABNORMAL HIGH (ref 0.2–1.2)

## 2021-02-26 LAB — HAPTOGLOBIN: Haptoglobin: <8 mg/dL — ABNORMAL LOW (ref 43–212)

## 2021-02-26 LAB — PATHOLOGIST SMEAR REVIEW

## 2021-02-26 NOTE — Progress Notes (Signed)
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 This is a benign condition that causes his red blood cells to burt more easily. I think this probably explains his elevated bilirubin. I would like to send him to see a hematologist to confirm and make sure there isnt anything else going on.  Please place referral to hematology for hemolytic anemia and elliptocytosis.  Katina Degree. [name], MD 02/26/2021 1:06 PM

## 2021-02-28 ENCOUNTER — Ambulatory Visit: Payer: BC Managed Care – PPO | Admitting: Family Medicine

## 2021-03-07 ENCOUNTER — Encounter: Payer: Self-pay | Admitting: Family Medicine

## 2021-03-07 ENCOUNTER — Telehealth: Payer: BC Managed Care – PPO | Admitting: Family Medicine

## 2021-03-07 VITALS — Temp 98.6°F | Ht 75.0 in | Wt 210.0 lb

## 2021-03-07 DIAGNOSIS — J3489 Other specified disorders of nose and nasal sinuses: Secondary | ICD-10-CM | POA: Diagnosis not present

## 2021-03-07 DIAGNOSIS — Z789 Other specified health status: Secondary | ICD-10-CM

## 2021-03-07 DIAGNOSIS — R059 Cough, unspecified: Secondary | ICD-10-CM

## 2021-03-07 DIAGNOSIS — R0981 Nasal congestion: Secondary | ICD-10-CM | POA: Diagnosis not present

## 2021-03-07 DIAGNOSIS — R0989 Other specified symptoms and signs involving the circulatory and respiratory systems: Secondary | ICD-10-CM

## 2021-03-07 MED ORDER — AZITHROMYCIN 250 MG PO TABS: ORAL_TABLET | ORAL | 0 refills | Status: DC

## 2021-03-07 MED ORDER — GUAIFENESIN-CODEINE 100-10 MG/5ML PO SOLN: 5.0000 mL | Freq: Four times a day (QID) | ORAL | 0 refills | Status: DC | PRN

## 2021-03-07 NOTE — Progress Notes (Signed)
Phone [phone] Virtual visit via Video note   Subjective:  Chief complaint: Chief Complaint  Patient presents with  . Chest Congestion    Started Tuesday.   . Sore Throat    Pain with swallowing   . Sinus Congestion    Started Tuesday.  Marland Kitchen Headache    Started Tuesday.   . Fatigue    Started Tuesday.   . Cough    Started Tuesday.   . Covid Testing    Patient tested everyday since Tuesday and all of his results are negative and he hasn't ha a fever at all.    This visit type was conducted due to national recommendations for restrictions regarding the COVID-19 Pandemic (e.g. social distancing).  This format is felt to be most appropriate for this patient at this time balancing risks to patient and risks to population by having him in for in person visit.  No physical exam was performed (except for noted visual exam or audio findings with Telehealth visits).    Our team/I connected with Scott Watts at  2:40 PM EDT by a video enabled telemedicine application ([email or web address] or caregility through epic) and verified that I am speaking with the correct person using two identifiers.  Location patient: [place] Location provider: [facility], office Persons participating in the virtual visit:  patient  Our team/I discussed the limitations of evaluation and management by telemedicine and the availability of in person appointments. In light of current covid-19 pandemic, patient also understands that we are trying to protect them by minimizing in office contact if at all possible.  The patient expressed consent for telemedicine visit and agreed to proceed. Patient understands insurance will be billed.   Past Medical History-  Patient Active Problem List   Diagnosis Date Noted  . B12 deficiency 02/18/2021  . Low HDL (under 40) 11/12/2019  . Androgenic alopecia 03/01/2019  . Condyloma 03/01/2019  . Insomnia 01/24/2019  . Chronic right-sided low back pain with right-sided sciatica 10/26/2018   . Anxiety 08/27/2018  . Erectile dysfunction 08/27/2018  . Skin lesion 08/27/2018  . Nicotine dependence with current use 08/27/2018    Medications- reviewed and updated Current Outpatient Medications  Medication Sig Dispense Refill  . baclofen (LIORESAL) 10 MG tablet Take 1 tablet (10 mg total) by mouth at bedtime as needed for muscle spasms (and sleep). 30 each 3  . busPIRone (BUSPAR) 10 MG tablet Take 1 tablet (10 mg total) by mouth 2 (two) times daily. 60 tablet 5  . clonazePAM (KLONOPIN) 1 MG tablet Take 0.5-1 tablets (0.5-1 mg total) by mouth 2 (two) times daily as needed for anxiety. 30 tablet 5  . diclofenac (VOLTAREN) 75 MG EC tablet Take 1 tablet (75 mg total) by mouth 2 (two) times daily. 30 tablet 0  . hydrOXYzine (ATARAX/VISTARIL) 25 MG tablet Take 1-2 tablets (25-50 mg total) by mouth at bedtime as needed (insomnia). 90 tablet 3  . tadalafil (CIALIS) 20 MG tablet Take 1 tablet (20 mg total) by mouth daily as needed for erectile dysfunction. 30 tablet 5  . traZODone (DESYREL) 50 MG tablet Take 1-2 tablets (50-100 mg total) by mouth at bedtime. 60 tablet 1   No current facility-administered medications for this visit.     Objective:  Temp 98.6 F (37 C) (Temporal)   Ht 6\' 3"  (1.905 m)   Wt 210 lb (95.3 kg)   BMI 26.25 kg/m  self reported vitals Gen: NAD, resting comfortably Lungs: nonlabored, normal respiratory rate  Skin: appears dry,  no obvious rash     Assessment and Plan   #Cough/fatigue/headache/sinus congestion/sore throat S: Patient states the symptoms started on Tuesday- felt weak and tired- was able to get through work but felt really tired and sleepy (also reports has not been getting the rest he needs).  Patient is done multiple COVID-19 test at home-daily since Tuesday and all have been negative. Patient  States 3 vaccines from covid 19. Denies fever. Sore throat much better today with better hydration. No shortness of breath- some fatigue.   Patient  states does some vaping and used to get more URIs when smoked.  A/P: Patient with cough/chest congestion/fatigue/sinus congestion and some headaches potentially related this as well as sore throat.  Multiple potential etiologies here.  We discussed ruling out COVID-19 (patient is triple vaccinated)-he plans to try to get tested at his local pharmacy.  With sore throat improving and also having cough doubt strep throat- offered testing but he lives in [place] and prefers not to drive here.  --With similar symptoms in the past patient has done very well with codeine-guaifenesin cough syrup.  I refilled this for him but we discussed do not drive for 8 hours after use or within 12 hours of clonazepam -Patient request antibiotics for his symptoms.  We discussed likely viral upper respiratory infection as well as viral sinusitis at this point with only 3 days of symptoms but we also discussed criteria for bacterial sinusitis including 10 days of symptoms or double sickening -With upcoming travel to [place] (if COVID test is negative) we discussed a "pocket prescription" for azithromycin.  He has done well with this medication in the past though we discussed it is not first-line for bacterial sinus infections  - recommended patient watch closely for shortness of breath or confusion or worsening symptoms and if those occur he should contact us immediately  -recommended patient consider purchasing pulse oximeter and if levels 94% or below persistently- seek care at the hospital  -recommended self quarantine until negative test  at minimum .  -Patient can work from home and does not need a work note at this time .  Recommended follow up: As needed if symptoms fail to improve or worsen Future Appointments  Date Time Provider Department Center  03/20/2021  3:00 PM Rickard Patience, MD [facility] None  03/20/2021  3:45 PM [facility] None    Lab/Order associations:   ICD-10-CM   1. Sinus congestion   R09.81   2. Sinus pressure  J34.89   3. Chest congestion  R09.89   4. Cough  R05.9   5. Afebrile  Z78.9     Meds ordered this encounter  Medications  . guaiFENesin-codeine 100-10 MG/5ML syrup    Sig: Take 5 mLs by mouth every 6 (six) hours as needed for cough (d). Do not drive for 8 hours after use. Do not take within 12 hours of klonopin/clonazepam.    Dispense:  120 mL    Refill:  0  . azithromycin (ZITHROMAX) 250 MG tablet    Sig: Take 2 tabs on day 1, then 1 tab daily until finished. If sinus symptoms persist 10 days or if improve then worsen.    Dispense:  6 tablet    Refill:  0   Time Spent: 26 minutes of total time (3:10 PM- 3:36 PM) was spent on the date of the encounter performing the following actions: chart review prior to seeing the patient, obtaining history, performing a medically necessary exam, counseling on  the treatment plan particularly reasons to start antibiotics as well as reasons we would like to avoid antibiotics if possible, placing orders, and documenting in our EHR.   I,Harris Phan,acting as a Neurosurgeon for Tana Conch, MD.,have documented all relevant documentation on the behalf of Tana Conch, MD,as directed by  Tana Conch, MD while in the presence of Tana Conch, MD.    I, Tana Conch, MD, have reviewed all documentation for this visit. The documentation on 03/07/21 for the exam, diagnosis, procedures, and orders are all accurate and complete.  Return precautions advised.  Tana Conch, MD

## 2021-03-19 ENCOUNTER — Ambulatory Visit: Payer: BC Managed Care – PPO | Admitting: Family Medicine

## 2021-03-20 ENCOUNTER — Other Ambulatory Visit: Payer: Self-pay

## 2021-03-20 ENCOUNTER — Inpatient Hospital Stay: Payer: BC Managed Care – PPO | Attending: Oncology | Admitting: Oncology

## 2021-03-20 ENCOUNTER — Encounter: Payer: Self-pay | Admitting: Oncology

## 2021-03-20 ENCOUNTER — Inpatient Hospital Stay: Payer: BC Managed Care – PPO

## 2021-03-20 VITALS — BP 133/80 | HR 66 | Temp 98.8°F | Ht 75.0 in | Wt 214.4 lb

## 2021-03-20 DIAGNOSIS — D599 Acquired hemolytic anemia, unspecified: Secondary | ICD-10-CM | POA: Diagnosis not present

## 2021-03-20 DIAGNOSIS — Z7289 Other problems related to lifestyle: Secondary | ICD-10-CM

## 2021-03-20 DIAGNOSIS — D7589 Other specified diseases of blood and blood-forming organs: Secondary | ICD-10-CM | POA: Insufficient documentation

## 2021-03-20 DIAGNOSIS — Z79899 Other long term (current) drug therapy: Secondary | ICD-10-CM | POA: Diagnosis not present

## 2021-03-20 DIAGNOSIS — D589 Hereditary hemolytic anemia, unspecified: Secondary | ICD-10-CM | POA: Insufficient documentation

## 2021-03-20 DIAGNOSIS — Z789 Other specified health status: Secondary | ICD-10-CM

## 2021-03-20 LAB — DAT, POLYSPECIFIC AHG (ARMC ONLY): Polyspecific AHG test: NEGATIVE

## 2021-03-20 LAB — RETIC PANEL
Immature Retic Fract: 24.2 % — ABNORMAL HIGH (ref 2.3–15.9)
RBC.: 3.96 MIL/uL — ABNORMAL LOW (ref 4.22–5.81)
Retic Count, Absolute: 255 10*3/uL — ABNORMAL HIGH (ref 19.0–186.0)
Retic Ct Pct: 6.4 % — ABNORMAL HIGH (ref 0.4–3.1)
Reticulocyte Hemoglobin: 36.6 pg (ref >27.9)

## 2021-03-20 LAB — HEPATITIS PANEL, ACUTE
HCV Ab: NONREACTIVE
Hep A IgM: NONREACTIVE
Hep B C IgM: NONREACTIVE
Hepatitis B Surface Ag: NONREACTIVE

## 2021-03-20 LAB — LACTATE DEHYDROGENASE: LDH: 217 U/L — ABNORMAL HIGH (ref 98–192)

## 2021-03-20 LAB — BILIRUBIN, FRACTIONATED(TOT/DIR/INDIR)
Bilirubin, Direct: 0.3 mg/dL — ABNORMAL HIGH (ref 0.0–0.2)
Indirect Bilirubin: 3.7 mg/dL — ABNORMAL HIGH (ref 0.3–0.9)
Total Bilirubin: 4 mg/dL — ABNORMAL HIGH (ref 0.3–1.2)

## 2021-03-20 LAB — CBC WITH DIFFERENTIAL/PLATELET
Abs Immature Granulocytes: 0.2 10*3/uL — ABNORMAL HIGH (ref 0.00–0.07)
Basophils Absolute: 0 10*3/uL (ref 0.0–0.1)
Basophils Relative: 1 %
Eosinophils Absolute: 0.1 10*3/uL (ref 0.0–0.5)
Eosinophils Relative: 1 %
HCT: 39.2 % (ref 39.0–52.0)
Hemoglobin: 13.1 g/dL (ref 13.0–17.0)
Immature Granulocytes: 3 %
Lymphocytes Relative: 34 %
Lymphs Abs: 2.7 10*3/uL (ref 0.7–4.0)
MCH: 32.8 pg (ref 26.0–34.0)
MCHC: 33.4 g/dL (ref 30.0–36.0)
MCV: 98.2 fL (ref 80.0–100.0)
Monocytes Absolute: 0.4 10*3/uL (ref 0.1–1.0)
Monocytes Relative: 5 %
Neutro Abs: 4.6 10*3/uL (ref 1.7–7.7)
Neutrophils Relative %: 56 %
Platelets: 169 10*3/uL (ref 150–400)
RBC: 3.99 MIL/uL — ABNORMAL LOW (ref 4.22–5.81)
RDW: 12.5 % (ref 11.5–15.5)
WBC: 8.1 10*3/uL (ref 4.0–10.5)
nRBC: 0 % (ref 0.0–0.2)

## 2021-03-20 LAB — FERRITIN: Ferritin: 281 ng/mL (ref 24–336)

## 2021-03-20 LAB — IRON AND TIBC
Iron: 86 ug/dL (ref 45–182)
Saturation Ratios: 24 % (ref 17.9–39.5)
TIBC: 357 ug/dL (ref 250–450)
UIBC: 271 ug/dL

## 2021-03-20 LAB — HIV ANTIBODY (ROUTINE TESTING W REFLEX): HIV Screen 4th Generation wRfx: NONREACTIVE

## 2021-03-20 LAB — PATHOLOGIST SMEAR REVIEW

## 2021-03-20 LAB — FOLATE: Folate: 10.9 ng/mL (ref >5.9)

## 2021-03-20 NOTE — Progress Notes (Signed)
Hematology/Oncology Consult note [facility] [phone] [phone]   Patient Care Team: Vivi Barrack, MD as PCP - General (Family Medicine)  REFERRING PROVIDER: Vivi Barrack, MD  CHIEF COMPLAINTS/REASON FOR VISIT:  Evaluation of hereditary hemolysis  HISTORY OF PRESENTING ILLNESS:   Scott Watts is a  40 y.o.  male with PMH listed below was seen in consultation at the request of  Vivi Barrack, MD  for evaluation of hereditary hemolysis  Patient had blood work done with primary care provider recently.  Was noted that he has increased total bilirubinemia, predominantly indirect bilirubinemia, macrocytosis and decreased haptoglobin.  Peripheral smear reviewed elliptocytes.  Primary care provider referred patient to establish care with hematology for evaluation of possible hereditary elliptocytosis.  Patient reports recent upper respiratory tract infection when he was in [place] cleaning up the house he grew up in.  He has increased oral hydration after noticed symptoms and most symptoms have improved. Denies fever, chills, unintentional weight loss, night sweats. For the past 6 months or so, he has increased consumption of alcohol.  He drinks alcohol daily. He vapes and he also smokes occasionally.  Sometimes he does nitrate poppers. Denies herbal supplementation.  I reviewed his previous medical records. 08/19/2010, total bilirubin 2 01/01/2011, CBC showed hemoglobin 16.4, MCV 88 09/13/2013,  CBC showed hemoglobin 15.1,  MCV 98, 11/11/2019, hemoglobin 14, MCV 99 09/11/2020, hemoglobin 12.4, MCV 102.8 02/18/2021, hemoglobin 14, MCV 103.2, total bilirubin 2.8, 02/25/2021, total bilirubin 2.1, indirect bilirubin 1.7, direct bilirubin 0.4. Haptoglobin less than 8, vitamin B12 547, TSH 1.19  Review of Systems  Constitutional:  Negative for appetite change, chills, fatigue and fever.  HENT:   Negative for hearing loss and voice change.    Eyes:  Negative for eye problems.  Respiratory:  Negative for chest tightness and cough.   Cardiovascular:  Negative for chest pain.  Gastrointestinal:  Negative for abdominal distention, abdominal pain and blood in stool.  Endocrine: Negative for hot flashes.  Genitourinary:  Negative for difficulty urinating and frequency.   Musculoskeletal:  Negative for arthralgias.  Skin:  Negative for itching and rash.  Neurological:  Negative for extremity weakness.  Hematological:  Negative for adenopathy.  Psychiatric/Behavioral:  Negative for confusion.    MEDICAL HISTORY:  Past Medical History:  Diagnosis Date   Low serum vitamin B12     SURGICAL HISTORY: Past Surgical History:  Procedure Laterality Date   COLONOSCOPY  2014   CONDYLOMA EXCISION/FULGURATION      SOCIAL HISTORY: Social History   Socioeconomic History   Marital status: Single    Spouse name: Not on file   Number of children: Not on file   Years of education: Not on file   Highest education level: Not on file  Occupational History   Not on file  Tobacco Use   Smoking status: Former    Packs/day: 0.50    Pack years: 0.00    Types: Cigarettes    Quit date: 02/28/2020    Years since quitting: 1.0   Smokeless tobacco: Never  Vaping Use   Vaping Use: Never used  Substance and Sexual Activity   Alcohol use: Yes    Alcohol/week: 2.0 standard drinks    Types: 2 Cans of beer per week   Drug use: Never   Sexual activity: Yes  Other Topics Concern   Not on file  Social History Narrative   Not on file   Social Determinants of Health   Financial  Resource Strain: Not on file  Food Insecurity: Not on file  Transportation Needs: Not on file  Physical Activity: Not on file  Stress: Not on file  Social Connections: Not on file  Intimate Partner Violence: Not on file    FAMILY HISTORY: Family History  Problem Relation Age of Onset   Cerebral aneurysm Mother    Alcohol abuse Father    Cancer Neg Hx      ALLERGIES:  has No Known Allergies.  MEDICATIONS:  Current Outpatient Medications  Medication Sig Dispense Refill   baclofen (LIORESAL) 10 MG tablet Take 1 tablet (10 mg total) by mouth at bedtime as needed for muscle spasms (and sleep). 30 each 3   busPIRone (BUSPAR) 10 MG tablet Take 1 tablet (10 mg total) by mouth 2 (two) times daily. 60 tablet 5   clonazePAM (KLONOPIN) 1 MG tablet Take 0.5-1 tablets (0.5-1 mg total) by mouth 2 (two) times daily as needed for anxiety. 30 tablet 5   diclofenac (VOLTAREN) 75 MG EC tablet Take 1 tablet (75 mg total) by mouth 2 (two) times daily. 30 tablet 0   hydrOXYzine (ATARAX/VISTARIL) 25 MG tablet Take 1-2 tablets (25-50 mg total) by mouth at bedtime as needed (insomnia). 90 tablet 3   tadalafil (CIALIS) 20 MG tablet Take 1 tablet (20 mg total) by mouth daily as needed for erectile dysfunction. 30 tablet 5   traZODone (DESYREL) 50 MG tablet Take 1-2 tablets (50-100 mg total) by mouth at bedtime. 60 tablet 1   ID NOW COVID-19 KIT TEST AS DIRECTED TODAY     No current facility-administered medications for this visit.     PHYSICAL EXAMINATION: ECOG PERFORMANCE STATUS: 0 - Asymptomatic Vitals:   03/20/21 1513  BP: 133/80  Pulse: 66  Temp: 98.8 F (37.1 C)  SpO2: 96%   Filed Weights   03/20/21 1513  Weight: 214 lb 6.4 oz (97.3 kg)    Physical Exam Constitutional:      General: He is not in acute distress. HENT:     Head: Normocephalic and atraumatic.  Eyes:     General: No scleral icterus. Cardiovascular:     Rate and Rhythm: Normal rate and regular rhythm.     Heart sounds: Normal heart sounds.  Pulmonary:     Effort: Pulmonary effort is normal. No respiratory distress.     Breath sounds: No wheezing.  Abdominal:     General: Bowel sounds are normal. There is no distension.     Palpations: Abdomen is soft.  Musculoskeletal:        General: No deformity. Normal range of motion.     Cervical back: Normal range of motion and neck  supple.  Skin:    General: Skin is warm and dry.     Findings: No erythema or rash.  Neurological:     Mental Status: He is alert and oriented to person, place, and time. Mental status is at baseline.     Cranial Nerves: No cranial nerve deficit.     Coordination: Coordination normal.  Psychiatric:        Mood and Affect: Mood normal.    LABORATORY DATA:  I have reviewed the data as listed Lab Results  Component Value Date   WBC 8.1 03/20/2021   HGB 13.1 03/20/2021   HCT 39.2 03/20/2021   MCV 98.2 03/20/2021   PLT 169 03/20/2021   Recent Labs    09/06/20 2259 02/18/21 1457 02/25/21 1543 03/20/21 1602  NA 139 139  --   --     K 3.6 4.2  --   --   CL 108 107  --   --   CO2 25 24  --   --   GLUCOSE 93 91  --   --   BUN 13 16  --   --   CREATININE 1.02 0.97  --   --   CALCIUM 9.8 9.5  --   --   GFRNONAA >60  --   --   --   PROT  --  6.6  --   --   ALBUMIN  --  4.9  --   --   AST  --  14  --   --   ALT  --  15  --   --   ALKPHOS  --  59  --   --   BILITOT  --  2.8* 2.1* 4.0*  BILIDIR  --   --  0.4* 0.3*  IBILI  --   --  1.7* 3.7*   Iron/TIBC/Ferritin/ %Sat    Component Value Date/Time   IRON 86 03/20/2021 1602   TIBC 357 03/20/2021 1602   FERRITIN 281 03/20/2021 1602   IRONPCTSAT 24 03/20/2021 1602      RADIOGRAPHIC STUDIES: I have personally reviewed the radiological images as listed and agreed with the findings in the report. No results found.    ASSESSMENT & PLAN:  1. Acquired hemolytic anemia (HCC)   2. Hyperbilirubinemia    #Acquired hemolysis. Previous labs reviewed and discussed with patient. He seems to have hemolysis given indirect hyperbilirubinemia, macrocytosis.  Physical examination showed no hepatosplenomegaly. remote lab records did not reveal macrocytosis.  Elliptocytosis usually presents with microcytosis.  I feel this is likely not hereditary process, rather, probably acquired process, possible etiology includes nutritional deficiency,  medication/substance side effects, autoimmune condition Repeat CBC, pathology smear, fractionated bilirubin, iron, TIBC ferritin, reticulocyte panel, hepatitis panel, HIV, LDH, folate, DAT.  Inhaled nitrate may attribute to the acquired hemolytic process.  Rule out other etiologies. Chronic alcohol use, discussed about alcohol cessation. Orders Placed This Encounter  Procedures   CBC with Differential/Platelet    Standing Status:   Future    Number of Occurrences:   1    Standing Expiration Date:   03/20/2022   Lactate dehydrogenase    Standing Status:   Future    Number of Occurrences:   1    Standing Expiration Date:   03/20/2022   Folate    Standing Status:   Future    Number of Occurrences:   1    Standing Expiration Date:   03/20/2022   Bilirubin, fractionated(tot/dir/indir)    Standing Status:   Future    Number of Occurrences:   1    Standing Expiration Date:   03/20/2022   Iron and TIBC    Standing Status:   Future    Number of Occurrences:   1    Standing Expiration Date:   03/20/2022   Ferritin    Standing Status:   Future    Number of Occurrences:   1    Standing Expiration Date:   09/19/2021   Pathologist smear review    Standing Status:   Future    Number of Occurrences:   1    Standing Expiration Date:   03/20/2022   Retic Panel    Standing Status:   Future    Number of Occurrences:   1    Standing Expiration Date:   03/20/2022   HIV Antibody (  routine testing w rflx)    Standing Status:   Future    Number of Occurrences:   1    Standing Expiration Date:   03/20/2022   Hepatitis panel, acute    Standing Status:   Future    Number of Occurrences:   1    Standing Expiration Date:   03/20/2022   DAT, polyspecific, [facility] ([facility])    Standing Status:   Future    Number of Occurrences:   1    Standing Expiration Date:   03/20/2022    All questions were answered. The patient knows to call the clinic with any problems questions or concerns.  cc Parker, Caleb M, MD     Return of visit: Virtual visit in 2 to 3 weeks to discuss results. Thank you for this kind referral and the opportunity to participate in the care of this patient. A copy of today's note is routed to referring provider    Zhou Yu, MD, PhD Hematology Oncology [facility] Pager- [phone] 03/20/2021  

## 2021-03-28 ENCOUNTER — Telehealth (INDEPENDENT_AMBULATORY_CARE_PROVIDER_SITE_OTHER): Payer: BC Managed Care – PPO | Admitting: Family Medicine

## 2021-03-28 ENCOUNTER — Encounter: Payer: Self-pay | Admitting: Family Medicine

## 2021-03-28 VITALS — Ht 75.0 in | Wt 207.0 lb

## 2021-03-28 DIAGNOSIS — D598 Other acquired hemolytic anemias: Secondary | ICD-10-CM | POA: Diagnosis not present

## 2021-03-28 DIAGNOSIS — J329 Chronic sinusitis, unspecified: Secondary | ICD-10-CM | POA: Diagnosis not present

## 2021-03-28 MED ORDER — AMOXICILLIN-POT CLAVULANATE 875-125 MG PO TABS: 1.0000 | ORAL_TABLET | Freq: Two times a day (BID) | ORAL | 0 refills | Status: DC

## 2021-03-28 NOTE — Progress Notes (Signed)
   Scott Watts is a 40 y.o. male who presents today for a telephone visit.  Assessment/Plan:  New/Acute Problems: Sinusitis Thankfully symptoms have improved over the last day or so given symptoms of been persistent for several weeks, we will start another course of antibiotics if symptoms do not continue to improve.  We will send in Augmentin.  If still not improving after this may consider referral to ENT.  Chronic Problems Addressed Today: Hemolytic anemia (HCC) Following with hematology.  We will follow-up with them again next week.     Subjective:  HPI:  Symptoms started several weeks ago. Saw a different provider about 3 months ago. Started on azithromycin. Symptoms have improved only modestly. Now having fatigue and exhaustion. Has some irritation at the back of the throat.        Objective/Observations   NAD  Telephone Visit   I connected with Scott Watts on 03/28/21 at  1:20 PM EDT via telephone and verified that I am speaking with the correct person using two identifiers. I discussed the limitations of evaluation and management by telemedicine and the availability of in person appointments. The patient expressed understanding and agreed to proceed.   Patient location: Home Provider location: [place] [employer] Persons participating in the virtual visit: Myself and Patient      Katina Degree. [name], MD 03/28/2021 1:51 PM

## 2021-03-28 NOTE — Assessment & Plan Note (Signed)
Following with hematology.  We will follow-up with them again next week.

## 2021-03-30 DIAGNOSIS — Z720 Tobacco use: Secondary | ICD-10-CM | POA: Diagnosis not present

## 2021-04-10 ENCOUNTER — Inpatient Hospital Stay: Payer: BC Managed Care – PPO | Attending: Oncology | Admitting: Oncology

## 2021-04-10 ENCOUNTER — Encounter: Payer: Self-pay | Admitting: Oncology

## 2021-04-10 DIAGNOSIS — D599 Acquired hemolytic anemia, unspecified: Secondary | ICD-10-CM | POA: Diagnosis not present

## 2021-04-10 NOTE — Progress Notes (Signed)
Patient contacted for Mychart visit. No new concerns voiced.  

## 2021-04-10 NOTE — Progress Notes (Signed)
HEMATOLOGY-ONCOLOGY TeleHEALTH VISIT PROGRESS NOTE  I connected with Scott Watts on 04/10/21  at  2:15 PM EDT by video enabled telemedicine visit and verified that I am speaking with the correct person using two identifiers. I discussed the limitations, risks, security and privacy concerns of performing an evaluation and management service by telemedicine and the availability of in-person appointments. The patient expressed understanding and agreed to proceed.   Other persons participating in the visit and their role in the encounter:  None  Patient's location: Home  Provider's location: office Chief Complaint: hemolysis, discuss labs.    INTERVAL HISTORY Scott Watts is a 40 y.o. male who has above history reviewed by me today presents for follow up visit for hemolysis.  Problems and complaints are listed below:  He has no new complaints. Has stopped using inhaved nitrates.   Review of Systems  Constitutional:  Negative for appetite change, chills, fatigue, fever and unexpected weight change.  HENT:   Negative for hearing loss and voice change.   Eyes:  Negative for eye problems and icterus.  Respiratory:  Negative for chest tightness, cough and shortness of breath.   Cardiovascular:  Negative for chest pain and leg swelling.  Gastrointestinal:  Negative for abdominal distention and abdominal pain.  Endocrine: Negative for hot flashes.  Genitourinary:  Negative for difficulty urinating, dysuria and frequency.   Musculoskeletal:  Negative for arthralgias.  Skin:  Negative for itching and rash.  Neurological:  Negative for light-headedness and numbness.  Hematological:  Negative for adenopathy. Does not bruise/bleed easily.  Psychiatric/Behavioral:  Negative for confusion.    Past Medical History:  Diagnosis Date   Low serum vitamin B12    Past Surgical History:  Procedure Laterality Date   COLONOSCOPY  2014   CONDYLOMA EXCISION/FULGURATION      Family History  Problem  Relation Age of Onset   Cerebral aneurysm Mother    Alcohol abuse Father    Cancer Neg Hx     Social History   Socioeconomic History   Marital status: Single    Spouse name: Not on file   Number of children: Not on file   Years of education: Not on file   Highest education level: Not on file  Occupational History   Not on file  Tobacco Use   Smoking status: Former    Packs/day: 0.50    Pack years: 0.00    Types: Cigarettes    Quit date: 02/28/2020    Years since quitting: 1.1   Smokeless tobacco: Never  Vaping Use   Vaping Use: Never used  Substance and Sexual Activity   Alcohol use: Yes    Alcohol/week: 2.0 standard drinks    Types: 2 Cans of beer per week   Drug use: Never   Sexual activity: Yes  Other Topics Concern   Not on file  Social History Narrative   Not on file   Social Determinants of Health   Financial Resource Strain: Not on file  Food Insecurity: Not on file  Transportation Needs: Not on file  Physical Activity: Not on file  Stress: Not on file  Social Connections: Not on file  Intimate Partner Violence: Not on file    Current Outpatient Medications on File Prior to Visit  Medication Sig Dispense Refill   baclofen (LIORESAL) 10 MG tablet Take 1 tablet (10 mg total) by mouth at bedtime as needed for muscle spasms (and sleep). 30 each 3   busPIRone (BUSPAR) 10 MG tablet Take 1 tablet (  10 mg total) by mouth 2 (two) times daily. 60 tablet 5   clonazePAM (KLONOPIN) 1 MG tablet Take 0.5-1 tablets (0.5-1 mg total) by mouth 2 (two) times daily as needed for anxiety. 30 tablet 5   diclofenac (VOLTAREN) 75 MG EC tablet Take 1 tablet (75 mg total) by mouth 2 (two) times daily. 30 tablet 0   hydrOXYzine (ATARAX/VISTARIL) 25 MG tablet Take 1-2 tablets (25-50 mg total) by mouth at bedtime as needed (insomnia). 90 tablet 3   tadalafil (CIALIS) 20 MG tablet Take 1 tablet (20 mg total) by mouth daily as needed for erectile dysfunction. 30 tablet 5   traZODone  (DESYREL) 50 MG tablet Take 1-2 tablets (50-100 mg total) by mouth at bedtime. 60 tablet 1   amoxicillin-clavulanate (AUGMENTIN) 875-125 MG tablet Take 1 tablet by mouth 2 (two) times daily. (Patient not taking: Reported on 04/10/2021) 20 tablet 0   ID NOW COVID-19 KIT TEST AS DIRECTED TODAY (Patient not taking: Reported on 04/10/2021)     No current facility-administered medications on file prior to visit.    No Known Allergies     Observations/Objective: There were no vitals filed for this visit. There is no height or weight on file to calculate BMI.  Physical Exam Neurological:     Mental Status: He is alert.    CBC    Component Value Date/Time   WBC 8.1 03/20/2021 1602   RBC 3.96 (L) 03/20/2021 1602   RBC 3.99 (L) 03/20/2021 1602   HGB 13.1 03/20/2021 1602   HCT 39.2 03/20/2021 1602   PLT 169 03/20/2021 1602   MCV 98.2 03/20/2021 1602   MCH 32.8 03/20/2021 1602   MCHC 33.4 03/20/2021 1602   RDW 12.5 03/20/2021 1602   LYMPHSABS 2.7 03/20/2021 1602   MONOABS 0.4 03/20/2021 1602   EOSABS 0.1 03/20/2021 1602   BASOSABS 0.0 03/20/2021 1602    CMP     Component Value Date/Time   NA 139 02/18/2021 1457   K 4.2 02/18/2021 1457   CL 107 02/18/2021 1457   CO2 24 02/18/2021 1457   GLUCOSE 91 02/18/2021 1457   BUN 16 02/18/2021 1457   CREATININE 0.97 02/18/2021 1457   CREATININE 1.13 11/11/2019 1555   CALCIUM 9.5 02/18/2021 1457   PROT 6.6 02/18/2021 1457   ALBUMIN 4.9 02/18/2021 1457   AST 14 02/18/2021 1457   ALT 15 02/18/2021 1457   ALKPHOS 59 02/18/2021 1457   BILITOT 4.0 (H) 03/20/2021 1602   GFRNONAA >60 09/06/2020 2259     Assessment and Plan: 1. Acquired hemolytic anemia (HCC)   2. Hyperbilirubinemia     Hemolytic anemia, Labs are reviewed and discussed with patient. DAT is negative, LDH elevated, low haptoglobin, increased indirect bilirubin.  Consistent with hemolysis.  Most likely hemolysis due to inhaled nitrates.  Recommend to stop using and repeat  testing in 6 weeks.  Check cbc, ldh, fractionated bilirubin, retic panel.  If improved counts,he will not need further follow up.  Patient agrees with the plan.  I discussed the assessment and treatment plan with the patient. The patient was provided an opportunity to ask questions and all were answered. The patient agreed with the plan and demonstrated an understanding of the instructions.  The patient was advised to call back or seek an in-person evaluation if the symptoms worsen or if the condition fails to improve as anticipated.    Earlie Server, MD 04/10/2021 7:06 PM
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
## 2021-05-08 ENCOUNTER — Ambulatory Visit: Payer: BC Managed Care – PPO | Admitting: Internal Medicine

## 2021-05-08 ENCOUNTER — Encounter: Payer: Self-pay | Admitting: Internal Medicine

## 2021-05-08 ENCOUNTER — Other Ambulatory Visit: Payer: Self-pay

## 2021-05-08 ENCOUNTER — Other Ambulatory Visit: Payer: Self-pay | Admitting: Family Medicine

## 2021-05-08 ENCOUNTER — Telehealth: Payer: Self-pay | Admitting: Internal Medicine

## 2021-05-08 VITALS — BP 120/78 | HR 75 | Temp 98.0°F | Ht 75.0 in | Wt 215.6 lb

## 2021-05-08 DIAGNOSIS — F17210 Nicotine dependence, cigarettes, uncomplicated: Secondary | ICD-10-CM | POA: Diagnosis not present

## 2021-05-08 DIAGNOSIS — R059 Cough, unspecified: Secondary | ICD-10-CM

## 2021-05-08 DIAGNOSIS — Z720 Tobacco use: Secondary | ICD-10-CM

## 2021-05-08 MED ORDER — ALBUTEROL SULFATE 108 (90 BASE) MCG/ACT IN AEPB: 2.0000 | INHALATION_SPRAY | Freq: Four times a day (QID) | RESPIRATORY_TRACT | 4 refills | Status: DC | PRN

## 2021-05-08 MED ORDER — PROAIR RESPICLICK 108 (90 BASE) MCG/ACT IN AEPB: 2.0000 | INHALATION_SPRAY | RESPIRATORY_TRACT | 5 refills | Status: DC | PRN

## 2021-05-08 MED ORDER — NICOTINE POLACRILEX 4 MG MT GUM
4.0000 mg | CHEWING_GUM | OROMUCOSAL | 0 refills | Status: DC | PRN
Start: 2021-05-08 — End: 2021-07-04

## 2021-05-08 NOTE — Patient Instructions (Addendum)
QUIT DATE is set for AUGUST 31st, 2022 Please use Nicorette Gum as needed Break habits to stay away from Cigarettes  Can use albuterol 2  puffs every 1 hr as needed  Obtain Pulmonary Function Testing

## 2021-05-08 NOTE — Progress Notes (Signed)
[facility] Consultation      Date: 05/08/2021,   MRN# [id number] Scott Watts 11/03/1980    Admission                  Current  Scott Watts is a 40 y.o. old male seen in consultation for cough at the request of Parker.     CHIEF COMPLAINT:   Cough   HISTORY OF PRESENT ILLNESS   40 yo white male seen today for persistent cough Patient is an active smoker 1 pack a day for the last 20 years Has intermittent cough associated with intermittent wheezing Patient does exercise several hours per week Does have increased wheezing when going up a flight of stairs   Patient here to establish a quit date He is willing to stop smoking on August 31 of this month Patient has a history of vaping Smokes 1 pack a day for the last 20 years  Patient has used albuterol inhaler in the past for viral bronchitis It has helped his wheezing  Patient would like to try nicotine replacement therapy I have advised that we need to start breaking habits first in conjunction with replacement therapy  No exacerbation at this time No evidence of heart failure at this time No evidence or signs of infection at this time No respiratory distress No fevers, chills, nausea, vomiting, diarrhea No evidence of lower extremity edema No evidence hemoptysis        CXR 09/2020 The CXR was Independently Reviewed By Me Today CXR reviewed-no acute findings No pneumonia No effusions  Smoking Assessment and Cessation Counseling Upon further questioning, Patient smokes 1 ppd I have advised patient to quit/stop smoking as soon as possible due to high risk for multiple medical problems  Patient is willing to quit smoking   I have advised patient that we can assist and have options of Nicotine replacement therapy. I also advised patient on behavioral therapy and can provide oral medication therapy in conjunction with the other therapies Follow up next Office visit  for assessment of  smoking cessation Smoking cessation counseling advised for 25  minutes   PAST MEDICAL HISTORY   Past Medical History:  Diagnosis Date   Low serum vitamin B12      SURGICAL HISTORY   Past Surgical History:  Procedure Laterality Date   COLONOSCOPY  2014   CONDYLOMA EXCISION/FULGURATION       FAMILY HISTORY   Family History  Problem Relation Age of Onset   Cerebral aneurysm Mother    Alcohol abuse Father    Cancer Neg Hx      SOCIAL HISTORY   Social History   Tobacco Use   Smoking status: Former    Packs/day: 0.50    Types: Cigarettes    Quit date: 02/28/2020    Years since quitting: 1.1   Smokeless tobacco: Never  Vaping Use   Vaping Use: Never used  Substance Use Topics   Alcohol use: Yes    Alcohol/week: 2.0 standard drinks    Types: 2 Cans of beer per week   Drug use: Never     MEDICATIONS    Home Medication:  Current Outpatient Rx   Order #: [id number]: Normal   Order #: [id number]: Normal   Order #: [id number]: Normal   Order #: [id number]: Normal   Order #: [id number]: Normal   Order #: [id number]: Normal   Order #: [id number]: Historical Med   Order #: [id number]: Normal   Order #: [id number]:   Normal    Current Medication:  Current Outpatient Medications:    amoxicillin-clavulanate (AUGMENTIN) 875-125 MG tablet, Take 1 tablet by mouth 2 (two) times daily. (Patient not taking: Reported on 04/10/2021), Disp: 20 tablet, Rfl: 0   baclofen (LIORESAL) 10 MG tablet, Take 1 tablet (10 mg total) by mouth at bedtime as needed for muscle spasms (and sleep)., Disp: 30 each, Rfl: 3   busPIRone (BUSPAR) 10 MG tablet, Take 1 tablet (10 mg total) by mouth 2 (two) times daily., Disp: 60 tablet, Rfl: 5   clonazePAM (KLONOPIN) 1 MG tablet, Take 0.5-1 tablets (0.5-1 mg total) by mouth 2 (two) times daily as needed for anxiety., Disp: 30 tablet, Rfl: 5   diclofenac (VOLTAREN) 75 MG EC tablet, Take 1 tablet (75 mg total) by  mouth 2 (two) times daily., Disp: 30 tablet, Rfl: 0   hydrOXYzine (ATARAX/VISTARIL) 25 MG tablet, Take 1-2 tablets (25-50 mg total) by mouth at bedtime as needed (insomnia)., Disp: 90 tablet, Rfl: 3   ID NOW COVID-19 KIT, TEST AS DIRECTED TODAY (Patient not taking: Reported on 04/10/2021), Disp: , Rfl:    tadalafil (CIALIS) 20 MG tablet, Take 1 tablet (20 mg total) by mouth daily as needed for erectile dysfunction., Disp: 30 tablet, Rfl: 5   traZODone (DESYREL) 50 MG tablet, Take 1-2 tablets (50-100 mg total) by mouth at bedtime., Disp: 60 tablet, Rfl: 1    ALLERGIES   Patient has no known allergies.     Review of Systems:  Gen:  Denies  fever, sweats, chills weight loss  HEENT: Denies blurred vision, double vision, ear pain, eye pain, hearing loss, nose bleeds, sore throat Cardiac:  No dizziness, chest pain or heaviness, chest tightness,edema, No JVD Resp:   No cough, -sputum production, -shortness of breath,-wheezing, -hemoptysis,  Gi: Denies swallowing difficulty, stomach pain, nausea or vomiting, diarrhea, constipation, bowel incontinence Gu:  Denies bladder incontinence, burning urine Ext:   Denies Joint pain, stiffness or swelling Skin: Denies  skin rash, easy bruising or bleeding or hives Endoc:  Denies polyuria, polydipsia , polyphagia or weight change Psych:   Denies depression, insomnia or hallucinations  Other:  All other systems negative  BP 120/78 (BP Location: Left Arm, Patient Position: Sitting, Cuff Size: Normal)   Pulse 75   Temp 98 F (36.7 C) (Oral)   Ht 6' 3" (1.905 m)   Wt 215 lb 9.6 oz (97.8 kg)   SpO2 95%   BMI 26.95 kg/m    Physical Examination:   General Appearance: No distress  EYES PERRLA, EOM intact.   NECK Supple, No JVD Pulmonary: normal breath sounds, No wheezing.  CardiovascularNormal S1,S2.  No m/r/g.   Abdomen: Benign, Soft, non-tender. Skin:   warm, no rashes, no ecchymosis  Extremities: normal, no cyanosis, clubbing. Neuro:without  focal findings,  speech normal  PSYCHIATRIC: Mood, affect within normal limits.   ALL OTHER ROS ARE NEGATIVE       ASSESSMENT/PLAN       40 year old pleasant white male seen today for persistent cough and wheezing for the last several months due to ongoing smoking and tobacco abuse causing intermittent bronchitis Patient is willing to stop smoking   QUIT DATE is set for AUGUST 31st, 2022 Please use Nicorette Gum as needed Break habits to stay away from Cigarettes  Can use albuterol 2  puffs every 1 hr as needed  Obtain Pulmonary Function Testing   CURRENT MEDICATIONS REVIEWED AT LENGTH WITH PATIENT TODAY   Patient  satisfied with Plan of  action and management. All questions answered  Follow up 3 months  Total Time Spent 45 minutes   Corrin Parker, M.D.  [facility]  Medical Director [place] Director [facility] Cardio-P[facility] Department          ;

## 2021-05-08 NOTE — Telephone Encounter (Signed)
Rx for Scott Watts has been corrected.  Scott Watts with [facility] is aware and voiced her understanding.  Nothing further needed at this time.

## 2021-05-20 ENCOUNTER — Telehealth: Payer: Self-pay

## 2021-05-20 NOTE — Telephone Encounter (Signed)
Called and LVM in regards to upcoming covid test. Need patient to confirm.

## 2021-05-21 NOTE — Telephone Encounter (Signed)
Called and spoke with pt making sure he was aware of the covid test appt and pt said that he was. Pt said he was give a paper after last OV with all instructions. Nothing further needed.

## 2021-05-22 ENCOUNTER — Other Ambulatory Visit
Admission: RE | Admit: 2021-05-22 | Discharge: 2021-05-22 | Disposition: A | Discharge status: Home / Self Care | Payer: BC Managed Care – PPO | Source: Ambulatory Visit | Attending: Internal Medicine | Admitting: Internal Medicine

## 2021-05-22 ENCOUNTER — Inpatient Hospital Stay: Payer: BC Managed Care – PPO

## 2021-05-22 ENCOUNTER — Other Ambulatory Visit: Payer: Self-pay

## 2021-05-22 DIAGNOSIS — Z20822 Contact with and (suspected) exposure to covid-19: Secondary | ICD-10-CM | POA: Insufficient documentation

## 2021-05-22 DIAGNOSIS — Z01812 Encounter for preprocedural laboratory examination: Secondary | ICD-10-CM | POA: Diagnosis not present

## 2021-05-22 LAB — SARS CORONAVIRUS 2 (TAT 6-24 HRS): SARS Coronavirus 2: NEGATIVE

## 2021-05-23 ENCOUNTER — Ambulatory Visit: Payer: BC Managed Care – PPO | Attending: Internal Medicine

## 2021-05-23 DIAGNOSIS — R059 Cough, unspecified: Secondary | ICD-10-CM | POA: Insufficient documentation

## 2021-05-23 MED ORDER — ALBUTEROL SULFATE (2.5 MG/3ML) 0.083% IN NEBU
2.5000 mg | INHALATION_SOLUTION | Freq: Once | RESPIRATORY_TRACT | Status: AC
  Administered 2021-05-23: 2.5 mg via RESPIRATORY_TRACT
  Filled 2021-05-23: qty 3

## 2021-06-19 ENCOUNTER — Inpatient Hospital Stay: Payer: BC Managed Care – PPO

## 2021-07-03 ENCOUNTER — Encounter: Payer: Self-pay | Admitting: Family Medicine

## 2021-07-03 DIAGNOSIS — U099 Post covid-19 condition, unspecified: Secondary | ICD-10-CM | POA: Diagnosis not present

## 2021-07-04 ENCOUNTER — Telehealth (INDEPENDENT_AMBULATORY_CARE_PROVIDER_SITE_OTHER): Payer: BC Managed Care – PPO | Admitting: Physician Assistant

## 2021-07-04 ENCOUNTER — Encounter: Payer: Self-pay | Admitting: Physician Assistant

## 2021-07-04 VITALS — Temp 101.2°F

## 2021-07-04 DIAGNOSIS — U071 COVID-19: Secondary | ICD-10-CM

## 2021-07-04 MED ORDER — MOLNUPIRAVIR EUA 200MG CAPSULE: 4.0000 | ORAL_CAPSULE | Freq: Two times a day (BID) | ORAL | 0 refills | Status: AC

## 2021-07-04 NOTE — Patient Instructions (Signed)
Take the Molnupiravir as directed. Please get a pulse ox and keep track of your saturation. If 92% or lower, go to ED!  Call with updates!

## 2021-07-04 NOTE — Progress Notes (Signed)
Virtual Visit via Video Note  I connected with  Scott Watts  on 07/04/21 at  9:00 AM EDT by a video enabled telemedicine application and verified that I am speaking with the correct person using two identifiers.  Location: Patient: home Provider: Nature conservation officer at [employer] Persons present: Patient and myself   I discussed the limitations of evaluation and management by telemedicine and the availability of in person appointments. The patient expressed understanding and agreed to proceed.   History of Present Illness: Chief complaint: COVID-19 + Symptom onset: 07/03/21 Pertinent positives: Fever, chills, body aches, chest tightness, congestion  Pertinent negatives: Chest pain, SOB Treatments tried: Tylenol / Ibuprofen, 1/2 tab Baclofen 10 mg  Vaccine status: Two Pfizer and booster  Sick exposure: Unknown Former smoker, he has seen pulmonologist.     Observations/Objective:   Gen: Awake, alert, no acute distress, very congested, teeth chattering  Resp: Breathing is even and non-labored Psych: calm/pleasant demeanor Neuro: Alert and Oriented x 3, + facial symmetry, speech is clear.   Assessment and Plan: 1. COVID-19 Diagnosis confirmed via home antigen test.  We discussed current algorithm recommendations for prescribing outpatient antivirals.  As the patient has a history of smoking and restrictive lung issues, antiviral treatment would be indicated at this time.  Risks versus benefits discussed.  We are going to treat with Molnupiravir.  Possible side effects discussed with the patient.  He is also going to use his albuterol inhaler as needed.  Advised self-isolation at home for the next 5 days and then masking around others for at least an additional 5 days.  Treat supportively at this time as well including sleeping prone, deep breathing exercises, pushing fluids, walking every few hours, vitamins C and D, and Tylenol or ibuprofen as needed.  The patient  understands that COVID-19 illness can wax and wane.  Should the symptoms acutely worsen or patient starts to experience sudden shortness of breath, chest pain, severe weakness, the patient will go straight to the emergency department.  Also advised home pulse oximetry monitoring and for any reading consistently under 92%, should also report to the emergency department.  The patient will continue to keep [place] updated.   Follow Up Instructions:    I discussed the assessment and treatment plan with the patient. The patient was provided an opportunity to ask questions and all were answered. The patient agreed with the plan and demonstrated an understanding of the instructions.   The patient was advised to call back or seek an in-person evaluation if the symptoms worsen or if the condition fails to improve as anticipated.  Airam Heidecker M Demarques Pilz, PA-C

## 2021-07-04 NOTE — Telephone Encounter (Signed)
Schedule virtual visit with provider today

## 2021-07-08 ENCOUNTER — Encounter: Payer: Self-pay | Admitting: Physician Assistant

## 2021-07-11 ENCOUNTER — Other Ambulatory Visit: Payer: Self-pay | Admitting: Physician Assistant

## 2021-08-21 ENCOUNTER — Other Ambulatory Visit: Payer: Self-pay | Admitting: Family Medicine

## 2021-09-12 IMAGING — CR DG CHEST 2V
1 series · 2 of 2 positions shown · non-contrast
Comparison: None.

CLINICAL DATA: Chest pain and shortness of breath, history of
recent COVID booster

EXAM:
CHEST - 2 VIEW

[Series 1: dg chest 2 view · 0.14mm/px · 2 of 2 slices shown]
[im 1/2]
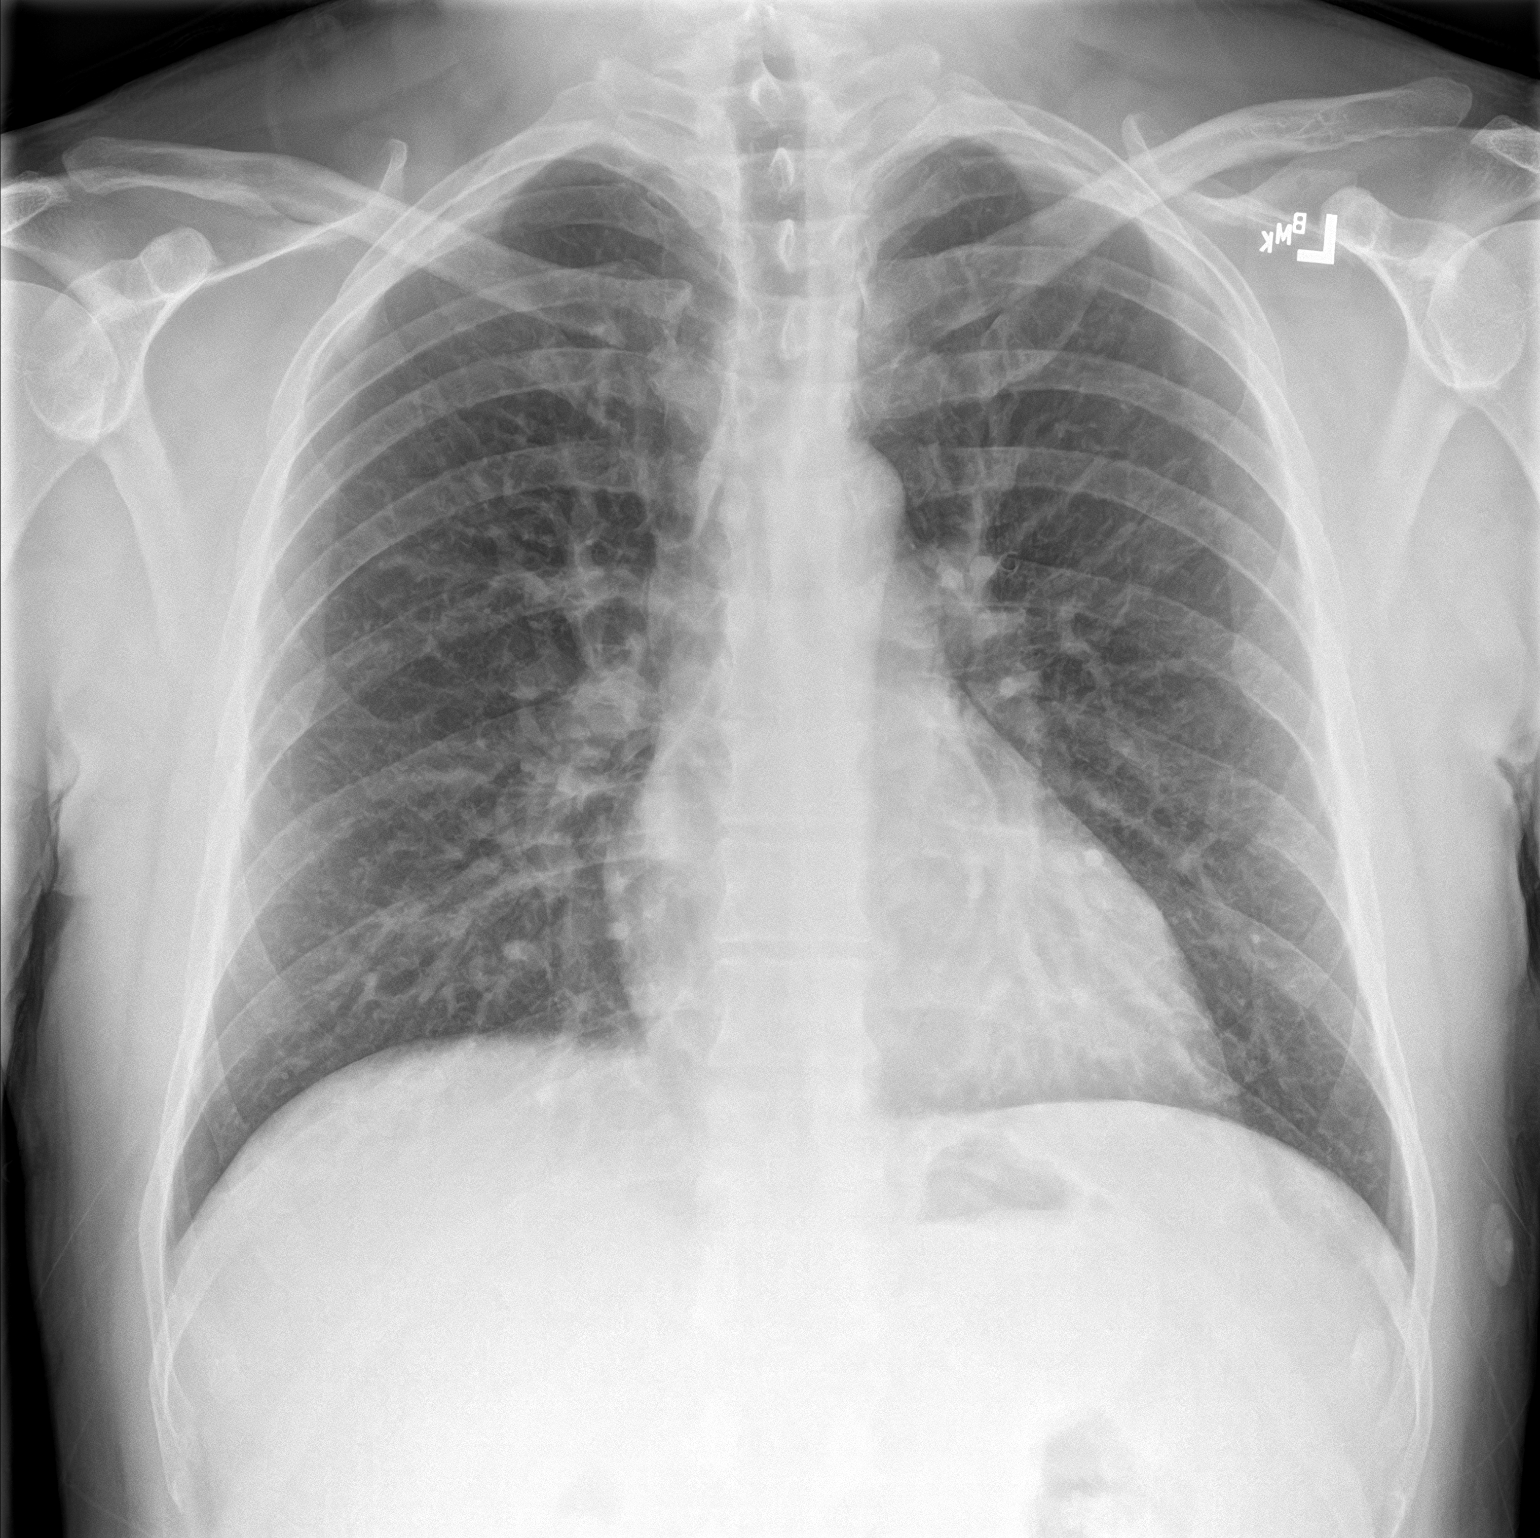
[im 2/2]
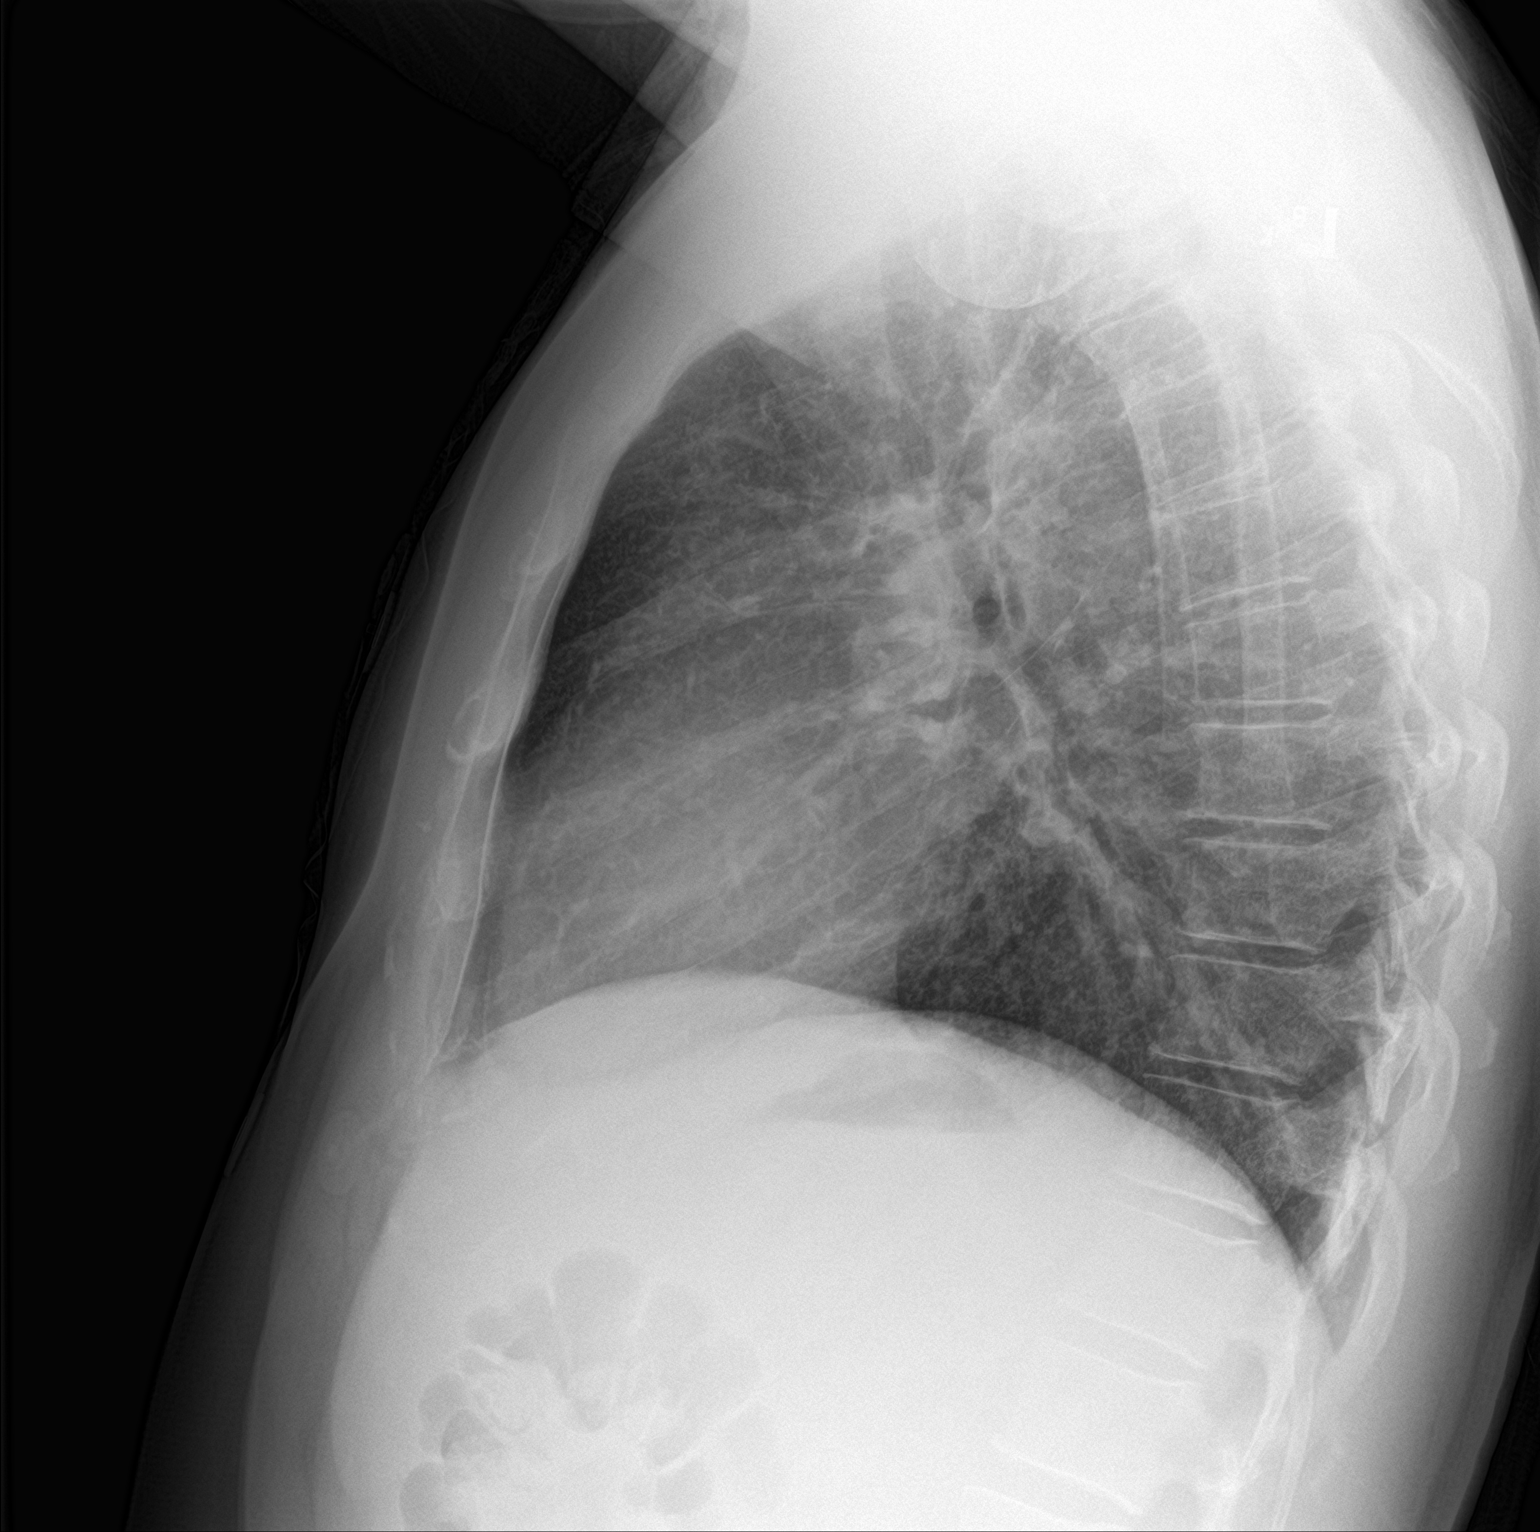

[2 of 2 positions shown; findings below may reference images not displayed]

FINDINGS: The heart size and mediastinal contours are within normal limits.
Both lungs are clear. The visualized skeletal structures are
unremarkable.
IMPRESSION: No active cardiopulmonary disease.

## 2021-09-20 ENCOUNTER — Other Ambulatory Visit: Payer: Self-pay | Admitting: Family Medicine

## 2021-10-10 ENCOUNTER — Encounter: Payer: Self-pay | Admitting: Family Medicine

## 2021-10-11 ENCOUNTER — Encounter: Payer: Self-pay | Admitting: Internal Medicine

## 2021-10-11 ENCOUNTER — Telehealth (INDEPENDENT_AMBULATORY_CARE_PROVIDER_SITE_OTHER): Payer: BC Managed Care – PPO | Admitting: Internal Medicine

## 2021-10-11 VITALS — HR 75 | Temp 98.5°F | Ht 75.0 in | Wt 205.0 lb

## 2021-10-11 DIAGNOSIS — J069 Acute upper respiratory infection, unspecified: Secondary | ICD-10-CM

## 2021-10-11 MED ORDER — AMOXICILLIN 875 MG PO TABS: 875.0000 mg | ORAL_TABLET | Freq: Two times a day (BID) | ORAL | 0 refills | Status: DC

## 2021-10-11 NOTE — Progress Notes (Signed)
Subjective:    Patient ID: Scott Watts, male    DOB: 1981-05-06, 41 y.o.   MRN: [id number]  DOS:  10/11/2021 Type of visit - description: Virtual Visit via Video Note  I connected with the above patient  by a video enabled telemedicine application and verified that I am speaking with the correct person using two identifiers.   THIS ENCOUNTER IS A VIRTUAL VISIT DUE TO COVID-19 - PATIENT WAS NOT SEEN IN THE OFFICE. PATIENT HAS CONSENTED TO VIRTUAL VISIT / TELEMEDICINE VISIT   Location of patient: home  Location of provider: office  Persons participating in the virtual visit: patient, provider   I discussed the limitations of evaluation and management by telemedicine and the availability of in person appointments. The patient expressed understanding and agreed to proceed.   Acute Symptoms started 5 days ago: Sinus congestion, fatigue, blowing yellowish discharge from the nose, symptoms are gradually worse.  Due to congestion has a hard time sleeping.  Denies fever or chills. O2 sat is low but he reports that is typical to him.  Denies chest pain, difficulty breathing. No major sneezing or cough. No watery eyes. Has tested negative for COVID several times.  Review of Systems See above   Past Medical History:  Diagnosis Date   Low serum vitamin B12     Past Surgical History:  Procedure Laterality Date   COLONOSCOPY  2014   CONDYLOMA EXCISION/FULGURATION      Allergies as of 10/11/2021   No Known Allergies      Medication List        Accurate as of October 11, 2021 11:59 PM. If you have any questions, ask your nurse or doctor.          Albuterol Sulfate 108 (90 Base) MCG/ACT Aepb Commonly known as: PROAIR RESPICLICK Inhale 2 puffs into the lungs every 6 (six) hours as needed.   amoxicillin 875 MG tablet Commonly known as: AMOXIL Take 1 tablet (875 mg total) by mouth 2 (two) times daily. Started by: Kathlene November, MD   baclofen 10 MG tablet Commonly known  as: LIORESAL Take 1 tablet (10 mg total) by mouth at bedtime as needed for muscle spasms (and sleep).   busPIRone 10 MG tablet Commonly known as: BUSPAR TAKE ONE TABLET BY MOUTH TWICE A DAY   clonazePAM 1 MG tablet Commonly known as: KLONOPIN TAKE 0.5-1 TABLET BY MOUTH TWO TIMES A DAY AS NEEDED FOR ANXIETY   diclofenac 75 MG EC tablet Commonly known as: VOLTAREN Take 1 tablet (75 mg total) by mouth 2 (two) times daily.   hydrOXYzine 25 MG tablet Commonly known as: ATARAX TAKE 1 TO 2 TABLETS BY MOUTH AT BEDTIME AS NEEDED FOR INSOMNIA   ID Now COVID-19 Kit Generic drug: COVID-19 Test   tadalafil 20 MG tablet Commonly known as: CIALIS TAKE ONE TABLET BY MOUTH DAILY AS NEEDED FOR ERECTILE DYSFUNCTION   traZODone 50 MG tablet Commonly known as: DESYREL Take 1-2 tablets (50-100 mg total) by mouth at bedtime.           Objective:   Physical Exam Pulse 75    Temp 98.5 F (36.9 C)    Ht '6\' 3"'  (1.905 m)    Wt 205 lb (93 kg)    SpO2 93%    BMI 25.62 kg/m  Patient is alert oriented x3, has some obvious nasal congestion, no cough noted, speaking in complete sentences, O2 sat is 93%, he reports that he is "typical" for him.  Assessment     41 year old male, history of hemolytic anemia, anxiety, previously vaccinated against COVID, presents with:  URI: Upper respiratory symptoms without cough, chest pain or difficulty breathing x5 days, has check a COVID test daily since the onset of symptoms and all tests were negative. O2 sat is 93%, states that that is typical for him. Denies a history of asthma or COPD.  He does vape. Plan: Conservative treatment with Flonase, Astelin, Mucinex.  If not better start amoxicillin.  See message for detailed instructions. Also recommend to proceed with a COVID and influenza vaccines once better. He verbalized understanding     I discussed the assessment and treatment plan with the patient. The patient was provided an opportunity to ask  questions and all were answered. The patient agreed with the plan and demonstrated an understanding of the instructions.   The patient was advised to call back or seek an in-person evaluation if the symptoms worsen or if the condition fails to improve as anticipated.

## 2021-10-25 ENCOUNTER — Encounter: Payer: Self-pay | Admitting: Physician Assistant

## 2021-10-25 ENCOUNTER — Other Ambulatory Visit: Payer: Self-pay

## 2021-10-25 ENCOUNTER — Telehealth (INDEPENDENT_AMBULATORY_CARE_PROVIDER_SITE_OTHER): Payer: BC Managed Care – PPO | Admitting: Physician Assistant

## 2021-10-25 VITALS — Temp 97.6°F | Ht 75.0 in | Wt 205.0 lb

## 2021-10-25 DIAGNOSIS — J320 Chronic maxillary sinusitis: Secondary | ICD-10-CM

## 2021-10-25 MED ORDER — DOXYCYCLINE HYCLATE 100 MG PO TABS: 100.0000 mg | ORAL_TABLET | Freq: Two times a day (BID) | ORAL | 0 refills | Status: AC

## 2021-10-25 NOTE — Progress Notes (Signed)
° °  Virtual Visit via Video Note  I connected with  Scott Watts  on 10/25/21 at 12:00 PM EST by a video enabled telemedicine application and verified that I am speaking with the correct person using two identifiers.  Location: Patient: home Provider: Nature conservation officer at [employer] Persons present: Patient and myself   I discussed the limitations of evaluation and management by telemedicine and the availability of in person appointments. The patient expressed understanding and agreed to proceed.   History of Present Illness:  Chief complaint: Sinus congestion Symptom onset: 10/21/21 Pertinent positives: Fatigue, sinus pressure on the left maxillary sinus, nasal congestion  Pertinent negatives: Fever, cough, SOB, CP, N/V/D, abdominal pain Treatments tried: amoxicillin - finished about one week ago; Vitamin C; Mucinex; pushing water / fluids; Flonase; Astelin  Vaccine status: First three COVID-19 vaccines Sick exposure: Unknown    Observations/Objective:   Gen: Awake, alert, no acute distress; very congested sounding, TTP over left maxillary sinus Resp: Breathing is even and non-labored Psych: calm/pleasant demeanor Neuro: Alert and Oriented x 3, + facial symmetry, speech is clear.   Assessment and Plan:  1. Left maxillary sinusitis Persistent symptoms despite conservative efforts at home. Will Rx doxycycline at this time, take with food. Cautioned on antibiotic use and possible side effects. Advised nasal saline, humidifier, and pushing fluids. Call if worse or no improvement.    Follow Up Instructions:    I discussed the assessment and treatment plan with the patient. The patient was provided an opportunity to ask questions and all were answered. The patient agreed with the plan and demonstrated an understanding of the instructions.   The patient was advised to call back or seek an in-person evaluation if the symptoms worsen or if the condition fails to  improve as anticipated.  Emileo Semel M Anh Bigos, PA-C

## 2021-12-18 ENCOUNTER — Other Ambulatory Visit: Payer: Self-pay | Admitting: Family Medicine

## 2021-12-18 NOTE — Telephone Encounter (Signed)
Rx refill request approved per Dr. Zollie Pee orders. In the future this medication should be prescribed and refilled by PCP, since pt has been d/c from Dr. Zollie Pee care for his head injury. ?

## 2022-02-24 ENCOUNTER — Other Ambulatory Visit: Payer: Self-pay | Admitting: Family Medicine

## 2022-03-20 ENCOUNTER — Other Ambulatory Visit: Payer: Self-pay | Admitting: Family Medicine

## 2022-03-25 ENCOUNTER — Other Ambulatory Visit: Payer: Self-pay | Admitting: Family Medicine

## 2022-03-26 ENCOUNTER — Telehealth: Payer: Self-pay | Admitting: Family Medicine

## 2022-03-26 MED ORDER — TADALAFIL 20 MG PO TABS: ORAL_TABLET | ORAL | 0 refills | Status: DC

## 2022-03-26 NOTE — Telephone Encounter (Signed)
Rx sent to pharmacy   

## 2022-03-26 NOTE — Telephone Encounter (Signed)
Patient requests RX for tadalafil (CIALIS) 20 MG tablet be sent to:  [facility] [id number] [place], [place] - [address] Phone:  [phone]  Fax:  [phone]     Patient was offered to make an appointment due to it being 1 year since last visit. Patient stated he is unable to make appointment at this time because he is working on getting health insurance and he will make an appointment after he has obtained health insurance.

## 2022-04-22 ENCOUNTER — Other Ambulatory Visit: Payer: Self-pay | Admitting: Family Medicine

## 2022-05-21 ENCOUNTER — Other Ambulatory Visit: Payer: Self-pay | Admitting: Family Medicine

## 2022-06-22 ENCOUNTER — Other Ambulatory Visit: Payer: Self-pay | Admitting: Family Medicine

## 2022-06-30 ENCOUNTER — Encounter: Payer: Self-pay | Admitting: *Deleted

## 2022-07-25 ENCOUNTER — Other Ambulatory Visit: Payer: Self-pay | Admitting: Family Medicine

## 2022-07-27 DIAGNOSIS — R1013 Epigastric pain: Secondary | ICD-10-CM | POA: Diagnosis not present

## 2022-07-27 DIAGNOSIS — K810 Acute cholecystitis: Secondary | ICD-10-CM | POA: Diagnosis not present

## 2022-07-27 DIAGNOSIS — K8020 Calculus of gallbladder without cholecystitis without obstruction: Secondary | ICD-10-CM | POA: Diagnosis not present

## 2022-07-28 ENCOUNTER — Telehealth: Payer: Self-pay | Admitting: Family Medicine

## 2022-07-28 DIAGNOSIS — R9431 Abnormal electrocardiogram [ECG] [EKG]: Secondary | ICD-10-CM | POA: Diagnosis not present

## 2022-07-28 NOTE — Telephone Encounter (Signed)
Patient went to [facility] ED in [place] on 07/27/22- he was told he was to have his Gallbladder removed ASAP -   Patients insurance does not kick in until another 9 days - he was told he cannot wait 9 days to have this removed.   Patient declined surgery as he did not want to ""go bankrupt  for the rest of his life. ""  Patient states he called the hospital to be provided with codes and he was told they could not help him.    Patient would like to speak to Dr Jerline Pain for his advise. Would like to know if he can wait 9 days for sx or not.   Patient sounds in distress.

## 2022-07-29 ENCOUNTER — Other Ambulatory Visit: Payer: Self-pay

## 2022-07-29 ENCOUNTER — Encounter (HOSPITAL_COMMUNITY): Payer: Self-pay

## 2022-07-29 ENCOUNTER — Encounter: Payer: Self-pay | Admitting: Family Medicine

## 2022-07-29 ENCOUNTER — Inpatient Hospital Stay (HOSPITAL_COMMUNITY)
Admission: EM | Admit: 2022-07-29 | Discharge: 2022-07-31 | DRG: 419 | Disposition: A | Discharge status: Home / Self Care | Payer: BC Managed Care – PPO | Attending: Internal Medicine | Admitting: Internal Medicine

## 2022-07-29 ENCOUNTER — Emergency Department (HOSPITAL_COMMUNITY): Payer: BC Managed Care – PPO

## 2022-07-29 ENCOUNTER — Inpatient Hospital Stay (HOSPITAL_COMMUNITY): Payer: BC Managed Care – PPO

## 2022-07-29 DIAGNOSIS — Z79899 Other long term (current) drug therapy: Secondary | ICD-10-CM

## 2022-07-29 DIAGNOSIS — R17 Unspecified jaundice: Principal | ICD-10-CM

## 2022-07-29 DIAGNOSIS — Z597 Insufficient social insurance and welfare support: Secondary | ICD-10-CM

## 2022-07-29 DIAGNOSIS — Z01818 Encounter for other preprocedural examination: Secondary | ICD-10-CM | POA: Diagnosis not present

## 2022-07-29 DIAGNOSIS — K8012 Calculus of gallbladder with acute and chronic cholecystitis without obstruction: Secondary | ICD-10-CM | POA: Diagnosis not present

## 2022-07-29 DIAGNOSIS — Z811 Family history of alcohol abuse and dependence: Secondary | ICD-10-CM | POA: Diagnosis not present

## 2022-07-29 DIAGNOSIS — K810 Acute cholecystitis: Secondary | ICD-10-CM | POA: Diagnosis present

## 2022-07-29 DIAGNOSIS — R112 Nausea with vomiting, unspecified: Secondary | ICD-10-CM | POA: Diagnosis present

## 2022-07-29 DIAGNOSIS — K828 Other specified diseases of gallbladder: Secondary | ICD-10-CM | POA: Diagnosis present

## 2022-07-29 DIAGNOSIS — F17210 Nicotine dependence, cigarettes, uncomplicated: Secondary | ICD-10-CM | POA: Diagnosis not present

## 2022-07-29 DIAGNOSIS — K8010 Calculus of gallbladder with chronic cholecystitis without obstruction: Secondary | ICD-10-CM | POA: Diagnosis not present

## 2022-07-29 DIAGNOSIS — K8020 Calculus of gallbladder without cholecystitis without obstruction: Secondary | ICD-10-CM | POA: Diagnosis not present

## 2022-07-29 DIAGNOSIS — L659 Nonscarring hair loss, unspecified: Secondary | ICD-10-CM | POA: Diagnosis not present

## 2022-07-29 DIAGNOSIS — R935 Abnormal findings on diagnostic imaging of other abdominal regions, including retroperitoneum: Secondary | ICD-10-CM | POA: Diagnosis not present

## 2022-07-29 DIAGNOSIS — F411 Generalized anxiety disorder: Secondary | ICD-10-CM | POA: Diagnosis not present

## 2022-07-29 DIAGNOSIS — R600 Localized edema: Secondary | ICD-10-CM | POA: Diagnosis not present

## 2022-07-29 DIAGNOSIS — G4700 Insomnia, unspecified: Secondary | ICD-10-CM | POA: Diagnosis not present

## 2022-07-29 DIAGNOSIS — R1011 Right upper quadrant pain: Secondary | ICD-10-CM | POA: Diagnosis not present

## 2022-07-29 HISTORY — DX: Unspecified jaundice: R17

## 2022-07-29 LAB — COMPREHENSIVE METABOLIC PANEL
ALT: 19 U/L (ref 0–44)
AST: 17 U/L (ref 15–41)
Albumin: 4.9 g/dL (ref 3.5–5.0)
Alkaline Phosphatase: 58 U/L (ref 38–126)
Anion gap: 7 (ref 5–15)
BUN: 18 mg/dL (ref 6–20)
CO2: 23 mmol/L (ref 22–32)
Calcium: 9.3 mg/dL (ref 8.9–10.3)
Chloride: 111 mmol/L (ref 98–111)
Creatinine, Ser: 1.13 mg/dL (ref 0.61–1.24)
GFR, Estimated: >60 mL/min (ref >60)
Glucose, Bld: 96 mg/dL (ref 70–99)
Potassium: 3.9 mmol/L (ref 3.5–5.1)
Sodium: 141 mmol/L (ref 135–145)
Total Bilirubin: 4.4 mg/dL — ABNORMAL HIGH (ref 0.3–1.2)
Total Protein: 7.5 g/dL (ref 6.5–8.1)

## 2022-07-29 LAB — URINALYSIS, ROUTINE W REFLEX MICROSCOPIC
Bilirubin Urine: NEGATIVE
Glucose, UA: NEGATIVE mg/dL
Hgb urine dipstick: NEGATIVE
Ketones, ur: NEGATIVE mg/dL
Leukocytes,Ua: NEGATIVE
Nitrite: NEGATIVE
Protein, ur: NEGATIVE mg/dL
Specific Gravity, Urine: 1.027 (ref 1.005–1.030)
pH: 5 (ref 5.0–8.0)

## 2022-07-29 LAB — CBC
HCT: 41.5 % (ref 39.0–52.0)
Hemoglobin: 13.3 g/dL (ref 13.0–17.0)
MCH: 33.5 pg (ref 26.0–34.0)
MCHC: 32 g/dL (ref 30.0–36.0)
MCV: 104.5 fL — ABNORMAL HIGH (ref 80.0–100.0)
Platelets: 163 10*3/uL (ref 150–400)
RBC: 3.97 MIL/uL — ABNORMAL LOW (ref 4.22–5.81)
RDW: 13.1 % (ref 11.5–15.5)
WBC: 8.3 10*3/uL (ref 4.0–10.5)
nRBC: 0 % (ref 0.0–0.2)

## 2022-07-29 LAB — LIPASE, BLOOD: Lipase: 28 U/L (ref 11–51)

## 2022-07-29 MED ORDER — HYDROCODONE-ACETAMINOPHEN 5-325 MG PO TABS
1.0000 | ORAL_TABLET | Freq: Once | ORAL | Status: AC
  Administered 2022-07-29: 1 via ORAL
  Filled 2022-07-29: qty 1

## 2022-07-29 MED ORDER — SODIUM CHLORIDE 0.9% FLUSH
3.0000 mL | Freq: Two times a day (BID) | INTRAVENOUS | Status: DC
  Administered 2022-07-30 – 2022-07-31 (×2): 3 mL via INTRAVENOUS

## 2022-07-29 MED ORDER — MORPHINE SULFATE (PF) 4 MG/ML IV SOLN
4.0000 mg | Freq: Once | INTRAVENOUS | Status: AC
  Administered 2022-07-29: 4 mg via INTRAVENOUS
  Filled 2022-07-29: qty 1

## 2022-07-29 MED ORDER — ONDANSETRON 4 MG PO TBDP
4.0000 mg | ORAL_TABLET | Freq: Once | ORAL | Status: AC
  Administered 2022-07-29: 4 mg via ORAL
  Filled 2022-07-29: qty 1

## 2022-07-29 MED ORDER — HYDROXYZINE HCL 25 MG PO TABS: 25.0000 mg | ORAL_TABLET | Freq: Every evening | ORAL | Status: DC | PRN

## 2022-07-29 MED ORDER — HYDROMORPHONE HCL 1 MG/ML IJ SOLN
0.5000 mg | INTRAMUSCULAR | Status: DC | PRN
  Administered 2022-07-29 – 2022-07-30 (×6): 0.5 mg via INTRAVENOUS
  Filled 2022-07-29 (×2): qty 0.5
  Filled 2022-07-29: qty 1
  Filled 2022-07-29 (×3): qty 0.5

## 2022-07-29 MED ORDER — SODIUM CHLORIDE 0.9 % IV SOLN
2.0000 g | Freq: Once | INTRAVENOUS | Status: AC
  Administered 2022-07-29: 2 g via INTRAVENOUS
  Filled 2022-07-29: qty 20

## 2022-07-29 MED ORDER — GADOBUTROL 1 MMOL/ML IV SOLN
9.5000 mL | Freq: Once | INTRAVENOUS | Status: AC | PRN
  Administered 2022-07-29: 9.5 mL via INTRAVENOUS

## 2022-07-29 MED ORDER — BUSPIRONE HCL 5 MG PO TABS
10.0000 mg | ORAL_TABLET | Freq: Two times a day (BID) | ORAL | Status: DC
  Administered 2022-07-30 – 2022-07-31 (×2): 10 mg via ORAL
  Filled 2022-07-29 (×2): qty 2

## 2022-07-29 MED ORDER — CLONAZEPAM 1 MG PO TABS
1.0000 mg | ORAL_TABLET | Freq: Two times a day (BID) | ORAL | Status: DC | PRN
  Administered 2022-07-30: 1 mg via ORAL
  Filled 2022-07-29: qty 1

## 2022-07-29 NOTE — ED Notes (Signed)
Ultrasound being completed at the bedside.

## 2022-07-29 NOTE — ED Provider Notes (Signed)
[facility] DEPT Provider Note   CSN: [id number] Arrival date & time: 07/29/22  1747     History  Chief Complaint  Patient presents with   Abdominal Pain   Back Pain   Emesis    Scott Watts is a 41 y.o. male Pt complains of severe right upper quadrant pain, nausea, vomiting, pain rating to the back.  Patient reports he was at the ED in [place], has multiple gallstones, bile duct dilatation, elevated bilirubin, they had recommended emergent gallbladder removal, at that time as patient is without insurance he had declined due to not knowing what the possible cost of this procedure would be, patient reports that since then he has been able to contact the financial services at [facility] to get an estimate for the procedure, and feels comfortable proceeding despite no insurance still today.  Patient denies systemic fever, chills but reports nausea, vomiting are worsening.  He is having difficulty tolerating anything by mouth.   Abdominal Pain Associated symptoms: vomiting   Back Pain Associated symptoms: abdominal pain   Emesis Associated symptoms: abdominal pain        Home Medications Prior to Admission medications   Medication Sig Start Date End Date Taking? Authorizing Provider  busPIRone (BUSPAR) 10 MG tablet TAKE ONE TABLET BY MOUTH TWICE A DAY Patient taking differently: Take 10 mg by mouth 2 (two) times daily. 02/24/22  Yes Vivi Barrack, MD  clonazePAM (KLONOPIN) 1 MG tablet TAKE 0.5 TO 1 TABLET BY MOUTH TWO TIMES A DAY AS NEEDED FOR ANXIETY Patient taking differently: Take 1 mg by mouth 2 (two) times daily as needed for anxiety. 02/24/22  Yes Vivi Barrack, MD  Emollient (COLLAGEN EX) Apply 1 Application topically daily.   Yes [provider]  hydrOXYzine (ATARAX) 25 MG tablet TAKE 1 TO 2 TABLETS BY MOUTH EVERY NIGHT AT BEDTIME AS NEEDED FOR INSOMNIA Patient taking differently: Take 25 mg by mouth at bedtime as needed  (insomnia). 12/18/21  Yes Gregor Hams, MD  ibuprofen (ADVIL) 200 MG tablet Take 200 mg by mouth every 6 (six) hours as needed for headache or mild pain.   Yes [provider]  tadalafil (CIALIS) 20 MG tablet TAKE 1 TABLET BY MOUTH DAILY AS NEEDED FOR ERECTILE DYSFUNCTION Patient taking differently: Take 20 mg by mouth daily as needed for erectile dysfunction. 06/23/22  Yes Vivi Barrack, MD  Albuterol Sulfate (PROAIR RESPICLICK) 709 (90 Base) MCG/ACT AEPB Inhale 2 puffs into the lungs every 6 (six) hours as needed. Patient not taking: Reported on 07/29/2022 05/08/21   Flora Lipps, MD  amoxicillin (AMOXIL) 875 MG tablet Take 1 tablet (875 mg total) by mouth 2 (two) times daily. Patient not taking: Reported on 10/25/2021 10/11/21   Colon Branch, MD  baclofen (LIORESAL) 10 MG tablet Take 1 tablet (10 mg total) by mouth at bedtime as needed for muscle spasms (and sleep). Patient not taking: Reported on 07/29/2022 08/01/20   Gregor Hams, MD  diclofenac (VOLTAREN) 75 MG EC tablet Take 1 tablet (75 mg total) by mouth 2 (two) times daily. Patient not taking: Reported on 07/29/2022 07/17/20   Inda Coke, PA  traZODone (DESYREL) 50 MG tablet Take 1-2 tablets (50-100 mg total) by mouth at bedtime. Patient not taking: Reported on 07/29/2022 09/03/20   Gregor Hams, MD      Allergies    Patient has no known allergies.    Review of Systems   Review of  Systems  Gastrointestinal:  Positive for abdominal pain and vomiting.  Musculoskeletal:  Positive for back pain.  All other systems reviewed and are negative.   Physical Exam Updated Vital Signs BP 137/80   Pulse 70   Temp 98.7 F (37.1 C) (Oral)   Resp 16   Ht 6\' 3"  (1.905 m)   Wt 94.3 kg   SpO2 91%   BMI 26.00 kg/m  Physical Exam Vitals and nursing note reviewed.  Constitutional:      General: He is not in acute distress.    Appearance: Normal appearance.  HENT:     Head: Normocephalic and atraumatic.  Eyes:     General:  Scleral icterus present.        Right eye: No discharge.        Left eye: No discharge.  Cardiovascular:     Rate and Rhythm: Normal rate and regular rhythm.     Heart sounds: No murmur heard.    No friction rub. No gallop.  Pulmonary:     Effort: Pulmonary effort is normal.     Breath sounds: Normal breath sounds.  Abdominal:     General: Bowel sounds are normal.     Palpations: Abdomen is soft.     Comments: Significant tenderness to palpation right upper quadrant, positive Murphy sign.  Some guarding, no rebound, rigidity, normal bowel sounds throughout.  Skin:    General: Skin is warm and dry.     Capillary Refill: Capillary refill takes less than 2 seconds.  Neurological:     Mental Status: He is alert and oriented to person, place, and time.  Psychiatric:        Mood and Affect: Mood normal.        Behavior: Behavior normal.     ED Results / Procedures / Treatments   Labs (all labs ordered are listed, but only abnormal results are displayed) Labs Reviewed  COMPREHENSIVE METABOLIC PANEL - Abnormal; Notable for the following components:      Result Value   Total Bilirubin 4.4 (*)    All other components within normal limits  CBC - Abnormal; Notable for the following components:   RBC 3.97 (*)    MCV 104.5 (*)    All other components within normal limits  URINALYSIS, ROUTINE W REFLEX MICROSCOPIC - Abnormal; Notable for the following components:   Color, Urine AMBER (*)    All other components within normal limits  LIPASE, BLOOD  HIV ANTIBODY (ROUTINE TESTING W REFLEX)    EKG None  Radiology Abdomen Limited RUQ (LIVER/GB)  Result Date: 07/29/2022 CLINICAL DATA:  Right upper quadrant pain. EXAM: ULTRASOUND ABDOMEN LIMITED RIGHT UPPER QUADRANT COMPARISON:  None Available. FINDINGS: Gallbladder: Stones and sludge are present within the gallbladder and there is mild gallbladder wall thickening at 4 mm. A sonographic 07/31/2022 sign is noted by sonographer. Common bile  duct: Diameter: 5 mm Liver: No focal lesion identified. Within normal limits in parenchymal echogenicity. Portal vein is patent on color Doppler imaging with normal direction of blood flow towards the liver. Other: No free fluid. IMPRESSION: Cholelithiasis and gallbladder sludge with gallbladder wall thickening and positive Murphy sign, suggestive of acute cholecystitis. Electronically Signed   By: Eulah Pont M.D.   On: 07/29/2022 20:05    Procedures Procedures    Medications Ordered in ED Medications  sodium chloride flush (NS) 0.9 % injection 3 mL (3 mLs Intravenous Not Given 07/29/22 2152)  HYDROmorphone (DILAUDID) injection 0.5 mg (has no administration  in time range)  hydrOXYzine (ATARAX) tablet 25 mg (has no administration in time range)  clonazePAM (KLONOPIN) tablet 1 mg (has no administration in time range)  busPIRone (BUSPAR) tablet 10 mg (has no administration in time range)  HYDROcodone-acetaminophen (NORCO/VICODIN) 5-325 MG per tablet 1 tablet (1 tablet Oral Given 07/29/22 1859)  ondansetron (ZOFRAN-ODT) disintegrating tablet 4 mg (4 mg Oral Given 07/29/22 1859)  cefTRIAXone (ROCEPHIN) 2 g in sodium chloride 0.9 % 100 mL IVPB (0 g Intravenous Stopped 07/29/22 2152)  morphine (PF) 4 MG/ML injection 4 mg (4 mg Intravenous Given 07/29/22 2102)  gadobutrol (GADAVIST) 1 MMOL/ML injection 9.5 mL (9.5 mLs Intravenous Contrast Given 07/29/22 2322)    ED Course/ Medical Decision Making/ A&P Clinical Course as of 07/29/22 2323  Tue Jul 29, 2022  2215 Alinda Money to admit [CP]    Clinical Course User Index [CP] Olene Floss, PA-C                           Medical Decision Making Amount and/or Complexity of Data Reviewed Labs: ordered. Radiology: ordered.  Risk Prescription drug management. Decision regarding hospitalization.   This patient is a 41 y.o. male who presents to the ED for concern of ruq pain with known gallbladder dysfunction, this involves an extensive  number of treatment options, and is a complaint that carries with it a high risk of complications and morbidity. The emergent differential diagnosis prior to evaluation includes, but is not limited to, acute cholecystitis, choledocholithiasis, cholangitis, pancreatitis, developing sepsis vs. other.   This is not an exhaustive differential.   Past Medical History / Co-morbidities / Social History: Anxiety, anemia  Additional history: Chart reviewed. Pertinent results include: Extensively reviewed recent visit at [facility] with diagnosis of gallstones, gallbladder sludge, possible developing early cholecystitis, with elevated T. bili of 3.1  Physical Exam: Physical exam performed. The pertinent findings include: Focal tenderness, positive Murphy sign right upper quadrant, developing scleral icterus and jaundice  Lab Tests: I ordered, and personally interpreted labs.  The pertinent results include: CBC unremarkable, I would have expected minor leukocytosis, however patient did receive IV antibiotics [facility].  Lipase is unremarkable, CMP is notable for worsening total bilirubin of 4.4 today.  His UA is unremarkable.   Imaging Studies: I ordered imaging studies including right upper quadrant ultrasound. I independently visualized and interpreted imaging which showed gallbladder stones, sludge, developing acute cholecystitis, common bile duct dilated at 5 mm. I agree with the radiologist interpretation.   Medications: I ordered medication including Rocephin for cholecystitis, morphine for pain, Zofran for nausea. Reevaluation of the patient after these medicines showed that the patient improved. I have reviewed the patients home medicines and have made adjustments as needed.  Consultations Obtained: I requested consultation with the surgeon, spoke with Dr. Corliss Skains, hospitalist, spoke with Dr. Alinda Money, gastroenterologist,  and discussed lab and imaging findings as well as  pertinent plan - they recommend: Surgical consult, MRCP, and hospital admission for choledocholithiasis   Disposition: After consideration of the diagnostic results and the patients response to treatment, I feel that patient would benefit from admission as discussed above for surgical removal of gallbladder, gallstones.   I discussed this case with my attending physician Dr. Silverio Lay who cosigned this note including patient's presenting symptoms, physical exam, and planned diagnostics and interventions. Attending physician stated agreement with plan or made changes to plan which were implemented.    Final Clinical  Impression(s) / ED Diagnoses Final diagnoses:  None    Rx / DC Orders ED Discharge Orders     None         [place] 07/29/22 2323    Charlynne Pander, MD 07/30/22 [phone]

## 2022-07-29 NOTE — ED Triage Notes (Signed)
Patient c/o mid upper abdominal pain that radiates into the back.  Patient states he went to another  ED in [place] and states that he was told he needed immediate surgery to have his gall bladder removed.  Patient also c/o N/V x 2 days.  Patient is very anxious.

## 2022-07-29 NOTE — ED Notes (Signed)
Patient transported to MRI 

## 2022-07-29 NOTE — ED Notes (Signed)
US at bedside

## 2022-07-29 NOTE — Telephone Encounter (Signed)
Please advise 

## 2022-07-29 NOTE — H&P (Signed)
History and Physical   ERIC NEES XTG:[id number] DOB: 08-09-1981 DOA: 07/29/2022  PCP: Ardith Dark, MD   Patient coming from: Home  Chief Complaint: Abdominal pain  HPI: Scott Watts is a 41 y.o. male with medical history significant of alopecia, anxiety, insomnia presenting with ongoing abdominal pain.  Patient has had intermittent abdominal pain for the past several months.  Located at the right upper quadrant and epigastric region.  Has had some associated nausea vomiting intermittently as well.  Recently it has been gradual becoming more more intense.  He was seen at outside hospital on 10/22 and there was concern there for cholecystitis versus choledocholithiasis.  Surgery was recommended but patient declined due to being uninsured and having no idea what the cost would be.  Patient has had continued symptoms and presented again for further evaluation.  He denies fevers, chills, chest pain, shortness of breath, constipation, diarrhea.  ED Course: Vital signs in the ED significant for blood pressure in the 120s to 140 systolic.  Lab work-up included CMP with T. bili 4.4.  CBC within normal limits.  Lipase normal.  Urinalysis normal.  Ultrasound showing cholelithiasis, sludge, wall thickening, positive Murphy sign concerning for acute cholecystitis.  Bile duct dilation measured at 5 mm which appears to be less than 2 days ago at 7 mm.  Patient received hydrocodone, morphine, ceftriaxone, Zofran in the ED.  General surgery was consulted and will follow along for surgical intervention when appropriate.  Requesting GI consultation for possible ERCP to remove any bile duct stones and to follow along.  GI has been consulted in the ED but not yet responded.  Review of Systems: As per HPI otherwise all other systems reviewed and are negative.  Past Medical History:  Diagnosis Date   Elevated bilirubin    Low serum vitamin B12     Past Surgical History:  Procedure Laterality  Date   COLONOSCOPY  2014   CONDYLOMA EXCISION/FULGURATION     orthodontics surgery      Social History  reports that he has been smoking cigarettes. He has been smoking an average of .5 packs per day. He has never used smokeless tobacco. He reports current alcohol use of about 2.0 standard drinks of alcohol per week. He reports that he does not use drugs.  No Known Allergies  Family History  Problem Relation Age of Onset   Cerebral aneurysm Mother    Alcohol abuse Father    Cancer Neg Hx   Reviewed on admission  Prior to Admission medications   Medication Sig Start Date End Date Taking? Authorizing Provider  Albuterol Sulfate (PROAIR RESPICLICK) 108 (90 Base) MCG/ACT AEPB Inhale 2 puffs into the lungs every 6 (six) hours as needed. 05/08/21   Erin Fulling, MD  amoxicillin (AMOXIL) 875 MG tablet Take 1 tablet (875 mg total) by mouth 2 (two) times daily. Patient not taking: Reported on 10/25/2021 10/11/21   Wanda Plump, MD  baclofen (LIORESAL) 10 MG tablet Take 1 tablet (10 mg total) by mouth at bedtime as needed for muscle spasms (and sleep). 08/01/20   Rodolph Bong, MD  busPIRone (BUSPAR) 10 MG tablet TAKE ONE TABLET BY MOUTH TWICE A DAY 02/24/22   Ardith Dark, MD  clonazePAM (KLONOPIN) 1 MG tablet TAKE 0.5 TO 1 TABLET BY MOUTH TWO TIMES A DAY AS NEEDED FOR ANXIETY 02/24/22   Ardith Dark, MD  diclofenac (VOLTAREN) 75 MG EC tablet Take 1 tablet (75 mg total) by mouth  2 (two) times daily. 07/17/20   Jarold Motto, PA  hydrOXYzine (ATARAX) 25 MG tablet TAKE 1 TO 2 TABLETS BY MOUTH EVERY NIGHT AT BEDTIME AS NEEDED FOR INSOMNIA 12/18/21   Rodolph Bong, MD  tadalafil (CIALIS) 20 MG tablet TAKE 1 TABLET BY MOUTH DAILY AS NEEDED FOR ERECTILE DYSFUNCTION 06/23/22   Ardith Dark, MD  traZODone (DESYREL) 50 MG tablet Take 1-2 tablets (50-100 mg total) by mouth at bedtime. 09/03/20   Rodolph Bong, MD    Physical Exam: Vitals:   07/29/22 2100 07/29/22 2103 07/29/22 2115 07/29/22 2145   BP: 135/88  124/71 137/80  Pulse: 76 76 65 70  Resp: 15   16  Temp:      TempSrc:      SpO2: 95% 94% 93% 91%  Weight:      Height:        Physical Exam Constitutional:      General: He is not in acute distress.    Appearance: Normal appearance.  HENT:     Head: Normocephalic and atraumatic.     Mouth/Throat:     Mouth: Mucous membranes are moist.     Pharynx: Oropharynx is clear.  Eyes:     Extraocular Movements: Extraocular movements intact.     Pupils: Pupils are equal, round, and reactive to light.  Cardiovascular:     Rate and Rhythm: Normal rate and regular rhythm.     Pulses: Normal pulses.     Heart sounds: Normal heart sounds.  Pulmonary:     Effort: Pulmonary effort is normal. No respiratory distress.     Breath sounds: Normal breath sounds.  Abdominal:     General: Bowel sounds are normal. There is no distension.     Palpations: Abdomen is soft.     Tenderness: There is abdominal tenderness.  Musculoskeletal:        General: No swelling or deformity.  Skin:    General: Skin is warm and dry.  Neurological:     General: No focal deficit present.     Mental Status: Mental status is at baseline.    Labs on Admission: I have personally reviewed following labs and imaging studies  CBC: Recent Labs  Lab 07/29/22 1928  WBC 8.3  HGB 13.3  HCT 41.5  MCV 104.5*  PLT 163    Basic Metabolic Panel: Recent Labs  Lab 07/29/22 1928  NA 141  K 3.9  CL 111  CO2 23  GLUCOSE 96  BUN 18  CREATININE 1.13  CALCIUM 9.3    GFR: Estimated Creatinine Clearance: 101.8 mL/min (by C-G formula based on SCr of 1.13 mg/dL).  Liver Function Tests: Recent Labs  Lab 07/29/22 1928  AST 17  ALT 19  ALKPHOS 58  BILITOT 4.4*  PROT 7.5  ALBUMIN 4.9    Urine analysis:    Component Value Date/Time   COLORURINE AMBER (A) 07/29/2022 1850   APPEARANCEUR CLEAR 07/29/2022 1850   LABSPEC 1.027 07/29/2022 1850   PHURINE 5.0 07/29/2022 1850   GLUCOSEU NEGATIVE  07/29/2022 1850   HGBUR NEGATIVE 07/29/2022 1850   BILIRUBINUR NEGATIVE 07/29/2022 1850   KETONESUR NEGATIVE 07/29/2022 1850   PROTEINUR NEGATIVE 07/29/2022 1850   NITRITE NEGATIVE 07/29/2022 1850   LEUKOCYTESUR NEGATIVE 07/29/2022 1850    Radiological Exams on Admission: US Abdomen Limited RUQ (LIVER/GB)  Result Date: 07/29/2022 CLINICAL DATA:  Right upper quadrant pain. EXAM: ULTRASOUND ABDOMEN LIMITED RIGHT UPPER QUADRANT COMPARISON:  None Available. FINDINGS: Gallbladder: Stones and sludge  are present within the gallbladder and there is mild gallbladder wall thickening at 4 mm. A sonographic Percell Miller sign is noted by sonographer. Common bile duct: Diameter: 5 mm Liver: No focal lesion identified. Within normal limits in parenchymal echogenicity. Portal vein is patent on color Doppler imaging with normal direction of blood flow towards the liver. Other: No free fluid. IMPRESSION: Cholelithiasis and gallbladder sludge with gallbladder wall thickening and positive Murphy sign, suggestive of acute cholecystitis. Electronically Signed   By: Brett Fairy M.D.   On: 07/29/2022 20:05    EKG: Not performed in the emergency department  Assessment/Plan Principal Problem:   Acute cholecystitis Active Problems:   Anxiety state   Insomnia, unspecified   Acute cholecystitis > Patient presenting with ongoing and worsening abdominal pain.  Seen 2 days ago at outside ED with concern for cholecystitis with possible choledocholithiasis.  Declined admission for surgical intervention at that time due to concern about cost and insurance. > Has had persistent symptoms and ultrasound again shows evidence of cholecystitis and common bile duct dilation to 5 mm down from 7 mm 2 days ago.  T. bili remains increasingly elevated at 4.4. > General surgery consulted and will follow along for surgical intervention when appropriate, they are concerned for some choledocholithiasis and obstruction with elevated T. bili and  mild enlarged common bile duct.  GI to be consulted for consideration of possible ERCP. - Monitor on MedSurg unit with continuous pulse ox - Appreciate general surgery and gastroenterology recommendations - Hold off on further ceftriaxone given lack of leukocytosis - Pain control as needed - Supportive care - Full liquid diet - N.p.o. at midnight  Anxiety Insomnia - Continue home BuSpar, hydroxyzine, Klonopin  DVT prophylaxis: SCDs Code Status:   Full Family Communication:  Updated at bedside  Disposition Plan:   Patient is from:  Home  Anticipated DC to:  Home  Anticipated DC date:  2 to 3 days  Anticipated DC barriers: None  Consults called:  General surgery, gastroenterology consulted in the ED. Admission status:  Inpatient, MedSurg,  Severity of Illness: The appropriate patient status for this patient is INPATIENT. Inpatient status is judged to be reasonable and necessary in order to provide the required intensity of service to ensure the patient's safety. The patient's presenting symptoms, physical exam findings, and initial radiographic and laboratory data in the context of their chronic comorbidities is felt to place them at high risk for further clinical deterioration. Furthermore, it is not anticipated that the patient will be medically stable for discharge from the hospital within 2 midnights of admission.   * I certify that at the point of admission it is my clinical judgment that the patient will require inpatient hospital care spanning beyond 2 midnights from the point of admission due to high intensity of service, high risk for further deterioration and high frequency of surveillance required.Marcelyn Bruins MD [facility]  How to contact the [facility] Attending or Consulting provider [place] or covering provider during after hours [place], for this patient?   Check the care team in [facility] and look for a) attending/consulting [facility] provider listed and b) the [facility]  team listed Log into [email or web address] and use [facility]'s universal password to access. If you do not have the password, please contact the hospital operator. Locate the [facility] provider you are looking for under [facility] and page to a number that you can be directly reached. If you still have difficulty reaching the  provider, please page the [facility] (Director on Call) for the Hospitalists listed on amion for assistance.  07/29/2022, 10:35 PM

## 2022-07-29 NOTE — ED Provider Triage Note (Signed)
Emergency Medicine Provider Triage Evaluation Note  Scott Watts , a 41 y.o. male  was evaluated in triage.  Pt complains of severe right upper quadrant pain, nausea, vomiting, pain rating to the back.  Patient reports he was at the ED in [place], has multiple gallstones, bile duct dilatation, elevated bilirubin, they had recommended emergent gallbladder removal, at that time as patient is without insurance he had declined due to not knowing what the possible cost of this procedure would be, patient reports that since then he has been able to contact the financial services at [facility] to get an estimate for the procedure, and feels comfortable proceeding despite no insurance still today.  Patient denies systemic fever, chills but reports nausea, vomiting are worsening.  He is having difficulty tolerating anything by mouth.  Review of Systems  Positive: Abdominal pain, nausea, vomiting Negative: Fever, chills  Physical Exam  BP (!) 147/87   Pulse 86   Temp 98.1 F (36.7 C) (Oral)   Resp 18   Ht 6\' 3"  (1.905 m)   Wt 94.3 kg   SpO2 90%   BMI 26.00 kg/m  Gen:   Awake, no distress   Resp:  Normal effort  MSK:   Moves extremities without difficulty  Other:  Focally tender in ruq with rebound, positive murphy sign  Medical Decision Making  Medically screening exam initiated at 6:57 PM.  Appropriate orders placed.  Scott Watts was informed that the remainder of the evaluation will be completed by another provider, this initial triage assessment does not replace that evaluation, and the importance of remaining in the ED until their evaluation is complete.  Workup initiated   Scott Watts, [place] 07/29/22 1857

## 2022-07-29 NOTE — Telephone Encounter (Signed)
Spoke with patient was notified information was send to PCP and waiting for respond. Patient stated need Code for surgery, since was not given any information at ED visit    Requesting to speak personally with PCP  . Offer an appointment, patient refused. Stated he is not getting help from PCP hung phone call

## 2022-07-29 NOTE — Telephone Encounter (Signed)
I am sorry that he is having issues with his gallbladder however if the recommendation was for him to have removal, then I would defer to the expertise of the surgeons that made that recommendation. I am sorry that he does not have insurance but his health should take priority and there are serious complications if he does not have his gallbladder taken out as recommended.  I am not sure what else we could offer at this point. There is no way for [place] to know the pricing for his surgery nor the codes used.  That being said, mychart messages are not an appropriate form of urgent communication. It is not reasonable for him to expect a response within 90 minutes of sending a message.   My recommendation is for him to go back to the ED ASAP to have the surgery as recommended by multiple physicians already.   Jeanett Schlein, can we start the dismissal process if he is not happy with his care here? It does not seem like we are a good fit for him any longer.  Thanks, Scott Watts. Jerline Pain, MD 07/29/2022 1:47 PM

## 2022-07-30 ENCOUNTER — Inpatient Hospital Stay (HOSPITAL_COMMUNITY): Payer: BC Managed Care – PPO

## 2022-07-30 ENCOUNTER — Encounter (HOSPITAL_COMMUNITY)
Admission: EM | Disposition: A | Discharge status: Home / Self Care | Payer: Self-pay | Source: Home / Self Care | Attending: Internal Medicine

## 2022-07-30 ENCOUNTER — Other Ambulatory Visit: Payer: Self-pay

## 2022-07-30 ENCOUNTER — Inpatient Hospital Stay (HOSPITAL_COMMUNITY): Payer: BC Managed Care – PPO | Admitting: Certified Registered Nurse Anesthetist

## 2022-07-30 DIAGNOSIS — K8000 Calculus of gallbladder with acute cholecystitis without obstruction: Secondary | ICD-10-CM

## 2022-07-30 HISTORY — PX: CHOLECYSTECTOMY: SHX55

## 2022-07-30 LAB — HIV ANTIBODY (ROUTINE TESTING W REFLEX): HIV Screen 4th Generation wRfx: NONREACTIVE

## 2022-07-30 SURGERY — LAPAROSCOPIC CHOLECYSTECTOMY WITH INTRAOPERATIVE CHOLANGIOGRAM
Anesthesia: General | Site: Abdomen

## 2022-07-30 MED ORDER — OXYCODONE HCL 5 MG/5ML PO SOLN: 5.0000 mg | Freq: Once | ORAL | Status: DC | PRN

## 2022-07-30 MED ORDER — CEFAZOLIN SODIUM-DEXTROSE 2-3 GM-%(50ML) IV SOLR
INTRAVENOUS | Status: DC | PRN
  Administered 2022-07-30: 2 g via INTRAVENOUS

## 2022-07-30 MED ORDER — SIMETHICONE 40 MG/0.6ML PO SUSP
40.0000 mg | Freq: Four times a day (QID) | ORAL | Status: DC | PRN
  Administered 2022-07-30 – 2022-07-31 (×4): 40 mg via ORAL
  Filled 2022-07-30 (×7): qty 0.6

## 2022-07-30 MED ORDER — HYDROMORPHONE HCL 1 MG/ML IJ SOLN
INTRAMUSCULAR | Status: DC | PRN
  Administered 2022-07-30: .5 mg via INTRAVENOUS
  Administered 2022-07-30: 1 mg via INTRAVENOUS
  Administered 2022-07-30: .5 mg via INTRAVENOUS

## 2022-07-30 MED ORDER — MIDAZOLAM HCL 2 MG/2ML IJ SOLN
INTRAMUSCULAR | Status: AC
  Filled 2022-07-30: qty 2

## 2022-07-30 MED ORDER — CEFAZOLIN SODIUM-DEXTROSE 2-4 GM/100ML-% IV SOLN
INTRAVENOUS | Status: AC
  Filled 2022-07-30: qty 100

## 2022-07-30 MED ORDER — FENTANYL CITRATE (PF) 250 MCG/5ML IJ SOLN
INTRAMUSCULAR | Status: AC
  Filled 2022-07-30: qty 5

## 2022-07-30 MED ORDER — IOHEXOL 300 MG/ML  SOLN
INTRAMUSCULAR | Status: DC | PRN
  Administered 2022-07-30: 7 mL

## 2022-07-30 MED ORDER — MIDAZOLAM HCL 5 MG/5ML IJ SOLN
INTRAMUSCULAR | Status: DC | PRN
  Administered 2022-07-30: 2 mg via INTRAVENOUS

## 2022-07-30 MED ORDER — FENTANYL CITRATE (PF) 100 MCG/2ML IJ SOLN
INTRAMUSCULAR | Status: DC | PRN
  Administered 2022-07-30 (×5): 50 ug via INTRAVENOUS

## 2022-07-30 MED ORDER — LIDOCAINE HCL (PF) 2 % IJ SOLN
INTRAMUSCULAR | Status: AC
  Filled 2022-07-30: qty 5

## 2022-07-30 MED ORDER — ONDANSETRON HCL 4 MG/2ML IJ SOLN
INTRAMUSCULAR | Status: DC | PRN
  Administered 2022-07-30: 4 mg via INTRAVENOUS

## 2022-07-30 MED ORDER — LIDOCAINE 2% (20 MG/ML) 5 ML SYRINGE
INTRAMUSCULAR | Status: DC | PRN
  Administered 2022-07-30: 60 mg via INTRAVENOUS

## 2022-07-30 MED ORDER — LACTATED RINGERS IR SOLN
Status: DC | PRN
  Administered 2022-07-30: 1000 mL

## 2022-07-30 MED ORDER — OXYCODONE HCL 5 MG PO TABS
5.0000 mg | ORAL_TABLET | ORAL | Status: DC | PRN
  Administered 2022-07-30: 10 mg via ORAL
  Administered 2022-07-31: 5 mg via ORAL
  Administered 2022-07-31: 10 mg via ORAL
  Administered 2022-07-31: 5 mg via ORAL
  Filled 2022-07-30: qty 1
  Filled 2022-07-30 (×2): qty 2
  Filled 2022-07-30: qty 1

## 2022-07-30 MED ORDER — HYDROMORPHONE HCL 2 MG/ML IJ SOLN
INTRAMUSCULAR | Status: AC
  Filled 2022-07-30: qty 1

## 2022-07-30 MED ORDER — ACETAMINOPHEN 160 MG/5ML PO SOLN: 1000.0000 mg | Freq: Once | ORAL | Status: DC | PRN

## 2022-07-30 MED ORDER — 0.9 % SODIUM CHLORIDE (POUR BTL) OPTIME
TOPICAL | Status: DC | PRN
  Administered 2022-07-30: 1000 mL

## 2022-07-30 MED ORDER — ROCURONIUM BROMIDE 10 MG/ML (PF) SYRINGE
PREFILLED_SYRINGE | INTRAVENOUS | Status: DC | PRN
  Administered 2022-07-30: 30 mg via INTRAVENOUS
  Administered 2022-07-30: 60 mg via INTRAVENOUS

## 2022-07-30 MED ORDER — PROPOFOL 10 MG/ML IV BOLUS
INTRAVENOUS | Status: AC
  Filled 2022-07-30: qty 20

## 2022-07-30 MED ORDER — SODIUM CHLORIDE 0.9 % IR SOLN: Status: DC | PRN

## 2022-07-30 MED ORDER — ACETAMINOPHEN 500 MG PO TABS: 1000.0000 mg | ORAL_TABLET | Freq: Once | ORAL | Status: DC | PRN

## 2022-07-30 MED ORDER — PROPOFOL 10 MG/ML IV BOLUS
INTRAVENOUS | Status: DC | PRN
  Administered 2022-07-30: 30 mg via INTRAVENOUS
  Administered 2022-07-30: 130 mg via INTRAVENOUS

## 2022-07-30 MED ORDER — CHLORHEXIDINE GLUCONATE 0.12 % MT SOLN
15.0000 mL | Freq: Once | OROMUCOSAL | Status: AC
  Administered 2022-07-30: 15 mL via OROMUCOSAL

## 2022-07-30 MED ORDER — DEXAMETHASONE SODIUM PHOSPHATE 10 MG/ML IJ SOLN
INTRAMUSCULAR | Status: AC
  Filled 2022-07-30: qty 1

## 2022-07-30 MED ORDER — BUPIVACAINE-EPINEPHRINE (PF) 0.25% -1:200000 IJ SOLN
INTRAMUSCULAR | Status: DC | PRN
  Administered 2022-07-30: 10 mL

## 2022-07-30 MED ORDER — SUGAMMADEX SODIUM 500 MG/5ML IV SOLN
INTRAVENOUS | Status: DC | PRN
  Administered 2022-07-30: 377.2 mg via INTRAVENOUS

## 2022-07-30 MED ORDER — ACETAMINOPHEN 10 MG/ML IV SOLN
1000.0000 mg | Freq: Once | INTRAVENOUS | Status: DC | PRN
  Administered 2022-07-30: 1000 mg via INTRAVENOUS

## 2022-07-30 MED ORDER — LACTATED RINGERS IV SOLN: INTRAVENOUS | Status: DC

## 2022-07-30 MED ORDER — ORAL CARE MOUTH RINSE: 15.0000 mL | Freq: Once | OROMUCOSAL | Status: AC

## 2022-07-30 MED ORDER — ACETAMINOPHEN 10 MG/ML IV SOLN
INTRAVENOUS | Status: AC
  Filled 2022-07-30: qty 100

## 2022-07-30 MED ORDER — OXYCODONE HCL 5 MG PO TABS: 5.0000 mg | ORAL_TABLET | Freq: Once | ORAL | Status: DC | PRN

## 2022-07-30 MED ORDER — DEXAMETHASONE SODIUM PHOSPHATE 4 MG/ML IJ SOLN
INTRAMUSCULAR | Status: DC | PRN
  Administered 2022-07-30: 8 mg via INTRAVENOUS

## 2022-07-30 MED ORDER — CEFAZOLIN (ANCEF) 1 G IV SOLR
2.0000 g | INTRAVENOUS | Status: DC
  Filled 2022-07-30: qty 2

## 2022-07-30 MED ORDER — ONDANSETRON HCL 4 MG/2ML IJ SOLN
INTRAMUSCULAR | Status: AC
  Filled 2022-07-30: qty 2

## 2022-07-30 MED ORDER — ACETAMINOPHEN 325 MG PO TABS
650.0000 mg | ORAL_TABLET | Freq: Four times a day (QID) | ORAL | Status: DC
  Administered 2022-07-30 – 2022-07-31 (×3): 650 mg via ORAL
  Filled 2022-07-30 (×3): qty 2

## 2022-07-30 MED ORDER — FENTANYL CITRATE PF 50 MCG/ML IJ SOSY
PREFILLED_SYRINGE | INTRAMUSCULAR | Status: AC
  Filled 2022-07-30: qty 1

## 2022-07-30 MED ORDER — ROCURONIUM BROMIDE 10 MG/ML (PF) SYRINGE
PREFILLED_SYRINGE | INTRAVENOUS | Status: AC
  Filled 2022-07-30: qty 10

## 2022-07-30 MED ORDER — FENTANYL CITRATE PF 50 MCG/ML IJ SOSY
25.0000 ug | PREFILLED_SYRINGE | INTRAMUSCULAR | Status: DC | PRN
  Administered 2022-07-30 (×3): 50 ug via INTRAVENOUS

## 2022-07-30 MED ORDER — FENTANYL CITRATE PF 50 MCG/ML IJ SOSY
PREFILLED_SYRINGE | INTRAMUSCULAR | Status: AC
  Filled 2022-07-30: qty 2

## 2022-07-30 SURGICAL SUPPLY — 35 items
APPLIER CLIP 5 13 M/L LIGAMAX5 (MISCELLANEOUS) ×1
BAG COUNTER SPONGE SURGICOUNT (BAG) IMPLANT
CABLE HIGH FREQUENCY MONO STRZ (ELECTRODE) ×1 IMPLANT
CHLORAPREP W/TINT 26 (MISCELLANEOUS) ×1 IMPLANT
CLIP APPLIE 5 13 M/L LIGAMAX5 (MISCELLANEOUS) IMPLANT
CLIP LIGATING HEMO O LOK GREEN (MISCELLANEOUS) ×1 IMPLANT
COVER MAYO STAND XLG (MISCELLANEOUS) ×1 IMPLANT
DERMABOND ADVANCED .7 DNX12 (GAUZE/BANDAGES/DRESSINGS) ×1 IMPLANT
DERMABOND ADVANCED .7 DNX6 (GAUZE/BANDAGES/DRESSINGS) IMPLANT
DRAPE C-ARM 42X120 X-RAY (DRAPES) ×1 IMPLANT
ELECT REM PT RETURN 15FT ADLT (MISCELLANEOUS) ×1 IMPLANT
GAUZE SPONGE 2X2 8PLY STRL LF (GAUZE/BANDAGES/DRESSINGS) ×1 IMPLANT
GLOVE BIO SURGEON STRL SZ7.5 (GLOVE) ×1 IMPLANT
GOWN STRL REUS W/ TWL XL LVL3 (GOWN DISPOSABLE) ×3 IMPLANT
GOWN STRL REUS W/TWL XL LVL3 (GOWN DISPOSABLE) ×3
GRASPER SUT TROCAR 14GX15 (MISCELLANEOUS) IMPLANT
IRRIG SUCT STRYKERFLOW 2 WTIP (MISCELLANEOUS) ×1
IRRIGATION SUCT STRKRFLW 2 WTP (MISCELLANEOUS) ×1 IMPLANT
KIT BASIN OR (CUSTOM PROCEDURE TRAY) ×1 IMPLANT
KIT TURNOVER KIT A (KITS) IMPLANT
NDL INSUFFLATION 14GA 120MM (NEEDLE) ×1 IMPLANT
NEEDLE INSUFFLATION 14GA 120MM (NEEDLE) ×1 IMPLANT
PENCIL SMOKE EVACUATOR (MISCELLANEOUS) IMPLANT
POUCH RETRIEVAL ECOSAC 10 (ENDOMECHANICALS) IMPLANT
POUCH RETRIEVAL ECOSAC 10MM (ENDOMECHANICALS) ×1
SCISSORS LAP 5X35 DISP (ENDOMECHANICALS) ×1 IMPLANT
SET CHOLANGIOGRAPH MIX (MISCELLANEOUS) ×1 IMPLANT
SET TUBE SMOKE EVAC HIGH FLOW (TUBING) ×1 IMPLANT
SLEEVE Z-THREAD 5X100MM (TROCAR) ×1 IMPLANT
SPIKE FLUID TRANSFER (MISCELLANEOUS) ×1 IMPLANT
SUT MNCRL AB 4-0 PS2 18 (SUTURE) ×1 IMPLANT
TOWEL OR 17X26 10 PK STRL BLUE (TOWEL DISPOSABLE) ×1 IMPLANT
TRAY LAPAROSCOPIC (CUSTOM PROCEDURE TRAY) ×1 IMPLANT
TROCAR 11X100 Z THREAD (TROCAR) ×1 IMPLANT
TROCAR Z-THREAD OPTICAL 5X100M (TROCAR) ×1 IMPLANT

## 2022-07-30 NOTE — ED Notes (Signed)
ED TO INPATIENT HANDOFF REPORT  ED Nurse Name and Phone #: Louie Casa Name/Age/Gender Kathrin Ruddy 41 y.o. male Room/Bed: WA25/WA25  Code Status   Code Status: Full Code  Home/SNF/Other Home Patient oriented to: self, place, time, and situation Is this baseline? Yes   Triage Complete: Triage complete  Chief Complaint Acute cholecystitis [K81.0]  Triage Note Patient c/o mid upper abdominal pain that radiates into the back.  Patient states he went to another  ED in [place] and states that he was told he needed immediate surgery to have his gall bladder removed.  Patient also c/o N/V x 2 days.  Patient is very anxious.   Allergies No Known Allergies  Level of Care/Admitting Diagnosis ED Disposition     ED Disposition  Admit   Condition  --   Comment  Hospital Area: [facility] [[id number]]  Level of Care: Med-Surg [16]  May admit patient to [facility] or [facility] if equivalent level of care is available:: No  Covid Evaluation: Asymptomatic - no recent exposure (last 10 days) testing not required  Diagnosis: Acute cholecystitis [575.0.ICD-9-CM]  Admitting Physician: Synetta Fail [[id number]]  Attending Physician: Synetta Fail [[id number]]  Certification:: I certify this patient will need inpatient services for at least 2 midnights  Estimated Length of Stay: 3          B Medical/Surgery History Past Medical History:  Diagnosis Date   Elevated bilirubin    Low serum vitamin B12    Past Surgical History:  Procedure Laterality Date   COLONOSCOPY  2014   CONDYLOMA EXCISION/FULGURATION     orthodontics surgery       A IV Location/Drains/Wounds Patient Lines/Drains/Airways Status     Active Line/Drains/Airways     Name Placement date Placement time Site Days   Peripheral IV 07/29/22 20 G Left Antecubital 07/29/22  2052  Antecubital  1            Intake/Output Last 24 hours No intake or output data in the 24 hours  ending 07/30/22 0129  Labs/Imaging Results for orders placed or performed during the hospital encounter of 07/29/22 (from the past 48 hour(s))  Urinalysis, Routine w reflex microscopic Urine, Clean Catch     Status: Abnormal   Collection Time: 07/29/22  6:50 PM  Result Value Ref Range   Color, Urine AMBER (A) YELLOW    Comment: BIOCHEMICALS MAY BE AFFECTED BY COLOR   APPearance CLEAR CLEAR   Specific Gravity, Urine 1.027 1.005 - 1.030   pH 5.0 5.0 - 8.0   Glucose, UA NEGATIVE NEGATIVE mg/dL   Hgb urine dipstick NEGATIVE NEGATIVE   Bilirubin Urine NEGATIVE NEGATIVE   Ketones, ur NEGATIVE NEGATIVE mg/dL   Protein, ur NEGATIVE NEGATIVE mg/dL   Nitrite NEGATIVE NEGATIVE   Leukocytes,Ua NEGATIVE NEGATIVE    Comment: Performed at [facility], [address], [place], [place] [address]  Lipase, blood     Status: None   Collection Time: 07/29/22  7:28 PM  Result Value Ref Range   Lipase 28 11 - 51 U/L    Comment: Performed at [facility], [address], [place], [place] [address]  Comprehensive metabolic panel     Status: Abnormal   Collection Time: 07/29/22  7:28 PM  Result Value Ref Range   Sodium 141 135 - 145 mmol/L   Potassium 3.9 3.5 - 5.1 mmol/L   Chloride 111 98 - 111 mmol/L   CO2  23 22 - 32 mmol/L   Glucose, Bld 96 70 - 99 mg/dL    Comment: Glucose reference range applies only to samples taken after fasting for at least 8 hours.   BUN 18 6 - 20 mg/dL   Creatinine, Ser 1.13 0.61 - 1.24 mg/dL   Calcium 9.3 8.9 - 10.3 mg/dL   Total Protein 7.5 6.5 - 8.1 g/dL   Albumin 4.9 3.5 - 5.0 g/dL   AST 17 15 - 41 U/L   ALT 19 0 - 44 U/L   Alkaline Phosphatase 58 38 - 126 U/L   Total Bilirubin 4.4 (H) 0.3 - 1.2 mg/dL   GFR, Estimated >60 >60 mL/min    Comment: (NOTE) Calculated using the CKD-EPI Creatinine Equation (2021)    Anion gap 7 5 - 15    Comment: Performed at [facility], [place] [address], [place], [place]  [address]  CBC     Status: Abnormal   Collection Time: 07/29/22  7:28 PM  Result Value Ref Range   WBC 8.3 4.0 - 10.5 K/uL   RBC 3.97 (L) 4.22 - 5.81 MIL/uL   Hemoglobin 13.3 13.0 - 17.0 g/dL   HCT 41.5 39.0 - 52.0 %   MCV 104.5 (H) 80.0 - 100.0 fL   MCH 33.5 26.0 - 34.0 pg   MCHC 32.0 30.0 - 36.0 g/dL   RDW 13.1 11.5 - 15.5 %   Platelets 163 150 - 400 K/uL   nRBC 0.0 0.0 - 0.2 %    Comment: Performed at [facility], [place] [address] [place], [place] [address]   MR ABDOMEN MRCP W WO CONTAST  Result Date: 07/30/2022 CLINICAL DATA:  Cholelithiasis EXAM: MRI ABDOMEN WITHOUT AND WITH CONTRAST (INCLUDING MRCP) TECHNIQUE: Multiplanar multisequence MR imaging of the abdomen was performed both before and after the administration of intravenous contrast. Heavily T2-weighted images of the biliary and pancreatic ducts were obtained, and three-dimensional MRCP images were rendered by post processing. CONTRAST:  9.53mL GADAVIST GADOBUTROL 1 MMOL/ML IV SOLN COMPARISON:  Ultrasound 07/29/2022 FINDINGS: Lower chest: No pleural effusions or focal consolidation. Hepatobiliary: Cholelithiasis with several stones in the gallbladder. Mild pericholecystic edema. Changes are consistent with acute cholecystitis in the appropriate clinical setting. No intra or extrahepatic bile duct dilatation. No filling defects in the common bile duct. No focal liver lesions. Pancreas: No mass, inflammatory changes, or other parenchymal abnormality identified. Spleen:  Within normal limits in size and appearance. Adrenals/Urinary Tract: No masses identified. No evidence of hydronephrosis. Stomach/Bowel: Visualized portions within the abdomen are unremarkable. Vascular/Lymphatic: No pathologically enlarged lymph nodes identified. No abdominal aortic aneurysm demonstrated. Other: No free fluid demonstrated in the upper abdomen. Visualized abdominal wall musculature appears intact. Musculoskeletal: No suspicious bone lesions  identified. IMPRESSION: 1. Cholelithiasis with pericholecystic edema. 2. No intra or extrahepatic bile duct dilatation. No common duct stones are identified. Electronically Signed   By: Lucienne Capers M.D.   On: 07/30/2022 00:08   MR 3D Recon At Scanner  Result Date: 07/30/2022 CLINICAL DATA:  Cholelithiasis EXAM: MRI ABDOMEN WITHOUT AND WITH CONTRAST (INCLUDING MRCP) TECHNIQUE: Multiplanar multisequence MR imaging of the abdomen was performed both before and after the administration of intravenous contrast. Heavily T2-weighted images of the biliary and pancreatic ducts were obtained, and three-dimensional MRCP images were rendered by post processing. CONTRAST:  9.28mL GADAVIST GADOBUTROL 1 MMOL/ML IV SOLN COMPARISON:  Ultrasound 07/29/2022 FINDINGS: Lower chest: No pleural effusions or focal consolidation. Hepatobiliary: Cholelithiasis with several stones in the gallbladder.  Mild pericholecystic edema. Changes are consistent with acute cholecystitis in the appropriate clinical setting. No intra or extrahepatic bile duct dilatation. No filling defects in the common bile duct. No focal liver lesions. Pancreas: No mass, inflammatory changes, or other parenchymal abnormality identified. Spleen:  Within normal limits in size and appearance. Adrenals/Urinary Tract: No masses identified. No evidence of hydronephrosis. Stomach/Bowel: Visualized portions within the abdomen are unremarkable. Vascular/Lymphatic: No pathologically enlarged lymph nodes identified. No abdominal aortic aneurysm demonstrated. Other: No free fluid demonstrated in the upper abdomen. Visualized abdominal wall musculature appears intact. Musculoskeletal: No suspicious bone lesions identified. IMPRESSION: 1. Cholelithiasis with pericholecystic edema. 2. No intra or extrahepatic bile duct dilatation. No common duct stones are identified. Electronically Signed   By: Burman Nieves M.D.   On: 07/30/2022 00:08   US Abdomen Limited RUQ  (LIVER/GB)  Result Date: 07/29/2022 CLINICAL DATA:  Right upper quadrant pain. EXAM: ULTRASOUND ABDOMEN LIMITED RIGHT UPPER QUADRANT COMPARISON:  None Available. FINDINGS: Gallbladder: Stones and sludge are present within the gallbladder and there is mild gallbladder wall thickening at 4 mm. A sonographic Eulah Pont sign is noted by sonographer. Common bile duct: Diameter: 5 mm Liver: No focal lesion identified. Within normal limits in parenchymal echogenicity. Portal vein is patent on color Doppler imaging with normal direction of blood flow towards the liver. Other: No free fluid. IMPRESSION: Cholelithiasis and gallbladder sludge with gallbladder wall thickening and positive Murphy sign, suggestive of acute cholecystitis. Electronically Signed   By: Thornell Sartorius M.D.   On: 07/29/2022 20:05    Pending Labs Unresulted Labs (From admission, onward)     Start     Ordered   07/29/22 2149  HIV Antibody (routine testing w rflx)  (HIV Antibody (Routine testing w reflex) panel)  Once,   R        07/29/22 2151            Vitals/Pain Today's Vitals   07/29/22 2345 07/30/22 0000 07/30/22 0049 07/30/22 0100  BP: 121/75 134/76  135/82  Pulse: 74 78  100  Resp: 15 16  15   Temp:      TempSrc:      SpO2: 90% 91%  93%  Weight:      Height:      PainSc:   Asleep     Isolation Precautions No active isolations  Medications Medications  sodium chloride flush (NS) 0.9 % injection 3 mL (3 mLs Intravenous Not Given 07/29/22 2152)  HYDROmorphone (DILAUDID) injection 0.5 mg (0.5 mg Intravenous Given 07/29/22 2335)  hydrOXYzine (ATARAX) tablet 25 mg (has no administration in time range)  clonazePAM (KLONOPIN) tablet 1 mg (has no administration in time range)  busPIRone (BUSPAR) tablet 10 mg (has no administration in time range)  HYDROcodone-acetaminophen (NORCO/VICODIN) 5-325 MG per tablet 1 tablet (1 tablet Oral Given 07/29/22 1859)  ondansetron (ZOFRAN-ODT) disintegrating tablet 4 mg (4 mg Oral Given  07/29/22 1859)  cefTRIAXone (ROCEPHIN) 2 g in sodium chloride 0.9 % 100 mL IVPB (0 g Intravenous Stopped 07/29/22 2152)  morphine (PF) 4 MG/ML injection 4 mg (4 mg Intravenous Given 07/29/22 2102)  gadobutrol (GADAVIST) 1 MMOL/ML injection 9.5 mL (9.5 mLs Intravenous Contrast Given 07/29/22 2322)    Mobility walks Low fall risk   Focused Assessments -   R Recommendations: See Admitting Provider Note  Report given to:   Additional Notes: -

## 2022-07-30 NOTE — Anesthesia Preprocedure Evaluation (Addendum)
Anesthesia Evaluation  Patient identified by MRN, date of birth, ID band Patient awake    Reviewed: Allergy & Precautions, NPO status , Patient's Chart, lab work & pertinent test results  Airway Mallampati: I  TM Distance: >3 FB Neck ROM: Full    Dental  (+) Teeth Intact, Dental Advisory Given   Pulmonary neg shortness of breath, neg sleep apnea, neg COPD, neg recent URI, Current Smoker and Patient abstained from smoking.,    breath sounds clear to auscultation       Cardiovascular negative cardio ROS   Rhythm:Regular     Neuro/Psych PSYCHIATRIC DISORDERS Anxiety  Neuromuscular disease    GI/Hepatic Neg liver ROS, Cholelithiasis   Endo/Other  negative endocrine ROS  Renal/GU negative Renal ROS     Musculoskeletal   Abdominal   Peds  Hematology negative hematology ROS (+) Lab Results      Component                Value               Date                      WBC                      8.3                 07/29/2022                HGB                      13.3                07/29/2022                HCT                      41.5                07/29/2022                MCV                      104.5 (H)           07/29/2022                PLT                      163                 07/29/2022              Anesthesia Other Findings   Reproductive/Obstetrics                            Anesthesia Physical Anesthesia Plan  ASA: 2  Anesthesia Plan: General   Post-op Pain Management: Ofirmev IV (intra-op)* and Toradol IV (intra-op)*   Induction: Intravenous  PONV Risk Score and Plan: 1 and Ondansetron and Dexamethasone  Airway Management Planned: Nasal Cannula and Natural Airway  Additional Equipment: None  Intra-op Plan:   Post-operative Plan: Extubation in OR  Informed Consent: I have reviewed the patients History and Physical, chart, labs and discussed the procedure including the  risks, benefits and alternatives for the proposed anesthesia with the patient or authorized representative  who has indicated his/her understanding and acceptance.     Dental advisory given  Plan Discussed with: CRNA  Anesthesia Plan Comments:         Anesthesia Quick Evaluation

## 2022-07-30 NOTE — Progress Notes (Signed)
Patient ID: Scott Watts, male   DOB: 11-23-1980, 41 y.o.   MRN: [id number]  MRCP negative for CBD stones or biliary dilation. Agree with lap chole with IOC and if IOC positive then will arrange ERCP.  Formal consult by GI cancelled pending surgical findings.

## 2022-07-30 NOTE — Discharge Instructions (Signed)
[facility] LAPAROSCOPIC SURGERY: POST OP INSTRUCTIONS Always review your discharge instruction sheet given to you by the facility where your surgery was performed. IF YOU HAVE DISABILITY OR FAMILY LEAVE FORMS, YOU MUST BRING THEM TO THE OFFICE FOR PROCESSING.   DO NOT GIVE THEM TO YOUR DOCTOR.  PAIN CONTROL  First take acetaminophen (Tylenol) AND/or ibuprofen (Advil) to control your pain after surgery.  Follow directions on package.  Taking acetaminophen (Tylenol) and/or ibuprofen (Advil) regularly after surgery will help to control your pain and lower the amount of prescription pain medication you may need.  You should not take more than 3,000 mg (3 grams) of acetaminophen (Tylenol) in 24 hours.  You should not take ibuprofen (Advil), aleve, motrin, naprosyn or other NSAIDS if you have a history of stomach ulcers or chronic kidney disease.  A prescription for pain medication may be given to you upon discharge.  Take your pain medication as prescribed, if you still have uncontrolled pain after taking acetaminophen (Tylenol) or ibuprofen (Advil). Use ice packs to help control pain. If you need a refill on your pain medication, please contact your pharmacy.  They will contact our office to request authorization. Prescriptions will not be filled after 5pm or on week-ends.  HOME MEDICATIONS Take your usually prescribed medications unless otherwise directed.  DIET You should follow a light diet the first few days after arrival home.  Be sure to include lots of fluids daily. Avoid fatty, fried foods.   CONSTIPATION It is common to experience some constipation after surgery and if you are taking pain medication.  Increasing fluid intake and taking a stool softener (such as Colace) will usually help or prevent this problem from occurring.  A mild laxative (Milk of Magnesia or Miralax) should be taken according to package instructions if there are no bowel movements after 48  hours.  WOUND/INCISION CARE Most patients will experience some swelling and bruising in the area of the incisions.  Ice packs will help.  Swelling and bruising can take several days to resolve.  Unless discharge instructions indicate otherwise, follow guidelines below  STERI-STRIPS - you may remove your outer bandages 48 hours after surgery, and you may shower at that time.  You have steri-strips (small skin tapes) in place directly over the incision.  These strips should be left on the skin for 7-10 days.   DERMABOND/SKIN GLUE - you may shower in 24 hours.  The glue will flake off over the next 2-3 weeks. Any sutures or staples will be removed at the office during your follow-up visit.  ACTIVITIES You may resume regular (light) daily activities beginning the next day--such as daily self-care, walking, climbing stairs--gradually increasing activities as tolerated.  You may have sexual intercourse when it is comfortable.  Refrain from any heavy lifting or straining until approved by your doctor. You may drive when you are no longer taking prescription pain medication, you can comfortably wear a seatbelt, and you can safely maneuver your car and apply brakes.  FOLLOW-UP You should see your doctor in the office for a follow-up appointment approximately 2-3 weeks after your surgery.  You should have been given your post-op/follow-up appointment when your surgery was scheduled.  If you did not receive a post-op/follow-up appointment, make sure that you call for this appointment within a day or two after you arrive home to insure a convenient appointment time.   WHEN TO CALL YOUR DOCTOR: Fever over 101.0 Inability to urinate Continued bleeding from incision.   Increased pain, redness, or drainage from the incision. Increasing abdominal pain  The clinic staff is available to answer your questions during regular business hours.  Please don't hesitate to call and ask to speak to one of the nurses for  clinical concerns.  If you have a medical emergency, go to the nearest emergency room or call 911.  A surgeon from [facility] is always on call at the hospital. [address], [address], [place], [place]  [address] ? [address] [address], [place], [place]   [address] [phone] ? [phone] ? FAX [phone] Web site: [email or web address]  

## 2022-07-30 NOTE — Transfer of Care (Signed)
Immediate Anesthesia Transfer of Care Note  Patient: Scott Watts  Procedure(s) Performed: Procedure(s): LAPAROSCOPIC CHOLECYSTECTOMY WITH INTRAOPERATIVE CHOLANGIOGRAM (N/A)  Patient Location: PACU  Anesthesia Type:General  Level of Consciousness: Patient easily awoken, sedated, comfortable, cooperative, following commands, responds to stimulation.   Airway & Oxygen Therapy: Patient spontaneously breathing, ventilating well, oxygen via simple oxygen mask.  Post-op Assessment: Report given to PACU RN, vital signs reviewed and stable, moving all extremities.   Post vital signs: Reviewed and stable.  Complications: No apparent anesthesia complications   Last Vitals:  Vitals Value Taken Time  BP 166/56 07/30/22 1336  Temp 36.3 C 07/30/22 1336  Pulse 78 07/30/22 1342  Resp 23 07/30/22 1342  SpO2 93 % 07/30/22 1342  Vitals shown include unvalidated device data.  Last Pain:  Vitals:   07/30/22 1010  TempSrc: Oral  PainSc:       Patients Stated Pain Goal: 2 (15/04/13 6438)  Complications: No notable events documented.

## 2022-07-30 NOTE — TOC Progression Note (Signed)
Transition of Care [facility]) - Progression Note    Patient Details  Name: KNOLAN SIMIEN MRN: [id number] Date of Birth: [birth year]  Transition of Care [facility] CM/SW Contact  Purcell Mouton, RN Phone Number: 07/30/2022, 3:36 PM  Clinical Narrative:      Transition of Care (TOC) Screening Note   Patient Details  Name: SHAWNEE HIGHAM Date of Birth: [birth year]   Transition of Care [facility]) CM/SW Contact:    Purcell Mouton, RN Phone Number: 07/30/2022, 3:36 PM    Transition of Care Department [facility]) has reviewed patient and no TOC needs have been identified at this time. We will continue to monitor patient advancement through interdisciplinary progression rounds. If new patient transition needs arise, please place a TOC consult.         Expected Discharge Plan and Services                                                 Social Determinants of Health (SDOH) Interventions    Readmission Risk Interventions     No data to display

## 2022-07-30 NOTE — Progress Notes (Signed)
PROGRESS NOTE    [name]  FUX:[id number] DOB: Jul 24, 1981 DOA: 07/29/2022 PCP: Vivi Barrack, MD    Brief Narrative:   Scott Watts is a 41 y.o. male with past medical history significant for alopecia, anxiety, insomnia who presented to [facility] ED on 10/24 with abdominal pain associated with nausea and vomiting.  Patient reports he recently went to [facility] ED and was told he needed surgery to have his gallbladder removed but he declined and left due to being uninsured and having no idea what the cost would be.  Pain has been present for the last several months and reported as intermittent.  Localized to the right upper quadrant and epigastric region.  Now becoming more intense and constant.  Denied fever, no chills, no chest pain, no shortness of breath, no diarrhea.  In the ED, temperature 98.1 F, HR 86, RR 18, BP 147/87, SPO2 90% on room air.  WBC 8.3, hemoglobin 13.3, platelets 163.  Sodium 141, potassium 3.9, chloride 111, CO2 23, glucose 96, BUN 18, creatinine 1.13.  Lipase 28.  AST 17, ALT 19, total bilirubin 4.4.  Urinalysis unrevealing.  Right upper quadrant ultrasound with cholelithiasis and gallbladder sludge with gallbladder wall thickening and positive Murphy sign suggestive of acute cholecystitis.  MRCP with cholelithiasis with pericholecystic edema, no intra or extrahepatic bile duct dilation, no CBD stones were identified.  General surgery was consulted.  Hospital service consulted for further evaluation and management.  Assessment & Plan:   Acute cholecystitis Patient presenting with persistent and progressive abdominal pain associated with nausea and vomiting.  Patient is afebrile without leukocytosis. Right upper quadrant ultrasound with cholelithiasis and gallbladder sludge with gallbladder wall thickening and positive Murphy sign suggestive of acute cholecystitis.  MRCP with cholelithiasis with pericholecystic edema, no intra or extrahepatic bile duct  dilation, no CBD stones were identified.  General surgery was consulted and patient underwent laparoscopic cholecystectomy with intraoperative cholangiogram by Dr. Winfred Burn on 07/30/2022 with findings of chronically inflamed gallbladder. -- General surgery following, appreciate assistance -- Clear liquid diet, advance as tolerates per general surgery -- Pain control with oxycodone as needed -- Repeat CMP in a.m.  Anxiety Insomnia -- BuSpar -- Klonopin -- Hydroxyzine   DVT prophylaxis: SCDs Start: 07/29/22 2149    Code Status: Full Code Family Communication: Family present at bedside  Disposition Plan:  Level of care: Med-Surg Status is: Inpatient Remains inpatient appropriate because: Pending laparoscopic cholecystectomy today    Consultants:  General surgery  Procedures:  Laparoscopic cholecystectomy with IOC, 10/25, Dr. Rosendo Gros  Antimicrobials:  Ceftriaxone 10/24 - 10/24 Cefazolin 10/25 - 10/25   Subjective: Patient seen examined bedside, resting comfortably.  Plan for laparoscopic cholecystectomy today.  Continues with mild abdominal pain, nausea.  No other complaints or concerns at this time.  Denies headache, no dizziness, no chest pain, no palpitations, no shortness of breath, no focal weakness, no fever/chills/night sweats, no current vomiting, no diarrhea, no paresthesias.  No acute events overnight per nursing staff.  Objective: Vitals:   07/30/22 1430 07/30/22 1452 07/30/22 1548 07/30/22 1649  BP: (!) 166/91 (!) 167/92 (!) 162/87 (!) 145/68  Pulse: 72 77 73 79  Resp: 20 20 18 18   Temp: (!) 97.5 F (36.4 C) 98 F (36.7 C)  (!) 97.5 F (36.4 C)  TempSrc:  Oral  Oral  SpO2: 93% 95% 90% 93%  Weight:      Height:        Intake/Output Summary (Last 24 hours) at  07/30/2022 1736 Last data filed at 07/30/2022 1340 Gross per 24 hour  Intake 750 ml  Output 425 ml  Net 325 ml   Filed Weights   07/29/22 1801  Weight: 94.3 kg    Examination:  Physical  Exam: GEN: NAD, alert and oriented x 3, wd/wn HEENT: NCAT, PERRL, EOMI, sclera clear, MMM PULM: CTAB w/o wheezes/crackles, normal respiratory effort CV: RRR w/o M/G/R GI: abd soft, mild right upper quadrant tenderness with epigastric tenderness, NABS, no R/G/M MSK: no peripheral edema, muscle strength globally intact 5/5 bilateral upper/lower extremities NEURO: CN II-XII intact, no focal deficits, sensation to light touch intact PSYCH: normal mood/affect Integumentary: dry/intact, no rashes or wounds    Data Reviewed: I have personally reviewed following labs and imaging studies  CBC: Recent Labs  Lab 07/29/22 1928  WBC 8.3  HGB 13.3  HCT 41.5  MCV 104.5*  PLT 163   Basic Metabolic Panel: Recent Labs  Lab 07/29/22 1928  NA 141  K 3.9  CL 111  CO2 23  GLUCOSE 96  BUN 18  CREATININE 1.13  CALCIUM 9.3   GFR: Estimated Creatinine Clearance: 101.8 mL/min (by C-G formula based on SCr of 1.13 mg/dL). Liver Function Tests: Recent Labs  Lab 07/29/22 1928  AST 17  ALT 19  ALKPHOS 58  BILITOT 4.4*  PROT 7.5  ALBUMIN 4.9   Recent Labs  Lab 07/29/22 1928  LIPASE 28   No results for input(s): "AMMONIA" in the last 168 hours. Coagulation Profile: No results for input(s): "INR", "PROTIME" in the last 168 hours. Cardiac Enzymes: No results for input(s): "CKTOTAL", "CKMB", "CKMBINDEX", "TROPONINI" in the last 168 hours. BNP (last 3 results) No results for input(s): "PROBNP" in the last 8760 hours. HbA1C: No results for input(s): "HGBA1C" in the last 72 hours. CBG: No results for input(s): "GLUCAP" in the last 168 hours. Lipid Profile: No results for input(s): "CHOL", "HDL", "LDLCALC", "TRIG", "CHOLHDL", "LDLDIRECT" in the last 72 hours. Thyroid Function Tests: No results for input(s): "TSH", "T4TOTAL", "FREET4", "T3FREE", "THYROIDAB" in the last 72 hours. Anemia Panel: No results for input(s): "VITAMINB12", "FOLATE", "FERRITIN", "TIBC", "IRON", "RETICCTPCT" in  the last 72 hours. Sepsis Labs: No results for input(s): "PROCALCITON", "LATICACIDVEN" in the last 168 hours.  No results found for this or any previous visit (from the past 240 hour(s)).       Radiology Studies: DG Cholangiogram Operative  Result Date: 07/30/2022 CLINICAL DATA:  Intraoperative cholangiogram during laparoscopic cholecystectomy. EXAM: INTRAOPERATIVE CHOLANGIOGRAM FLUOROSCOPY TIME:  23 seconds (14.3 mGy) COMPARISON:  None Available. FINDINGS: Intraoperative cholangiographic images of the right upper abdominal quadrant during laparoscopic cholecystectomy are provided for review. Surgical clips overlie the expected location of the gallbladder fossa. Contrast injection demonstrates selective cannulation of the central aspect of the cystic duct. There is passage of contrast through the central aspect of the cystic duct with filling of a non dilated common bile duct. There is passage of contrast though the CBD and into the descending portion of the duodenum. There is minimal reflux of injected contrast into the common hepatic duct and central aspect of the non dilated intrahepatic biliary system. There is suspected faint opacification of the central most aspect of the pancreatic duct just superior to the level of the ampulla. There are no discrete filling defects within the opacified portions of the biliary system to suggest the presence of choledocholithiasis. IMPRESSION: No evidence of choledocholithiasis. Electronically Signed   By: Simonne Come M.D.   On: 07/30/2022 14:00  MR ABDOMEN MRCP W WO CONTAST  Result Date: 07/30/2022 CLINICAL DATA:  Cholelithiasis EXAM: MRI ABDOMEN WITHOUT AND WITH CONTRAST (INCLUDING MRCP) TECHNIQUE: Multiplanar multisequence MR imaging of the abdomen was performed both before and after the administration of intravenous contrast. Heavily T2-weighted images of the biliary and pancreatic ducts were obtained, and three-dimensional MRCP images were rendered by  post processing. CONTRAST:  9.68mL GADAVIST GADOBUTROL 1 MMOL/ML IV SOLN COMPARISON:  Ultrasound 07/29/2022 FINDINGS: Lower chest: No pleural effusions or focal consolidation. Hepatobiliary: Cholelithiasis with several stones in the gallbladder. Mild pericholecystic edema. Changes are consistent with acute cholecystitis in the appropriate clinical setting. No intra or extrahepatic bile duct dilatation. No filling defects in the common bile duct. No focal liver lesions. Pancreas: No mass, inflammatory changes, or other parenchymal abnormality identified. Spleen:  Within normal limits in size and appearance. Adrenals/Urinary Tract: No masses identified. No evidence of hydronephrosis. Stomach/Bowel: Visualized portions within the abdomen are unremarkable. Vascular/Lymphatic: No pathologically enlarged lymph nodes identified. No abdominal aortic aneurysm demonstrated. Other: No free fluid demonstrated in the upper abdomen. Visualized abdominal wall musculature appears intact. Musculoskeletal: No suspicious bone lesions identified. IMPRESSION: 1. Cholelithiasis with pericholecystic edema. 2. No intra or extrahepatic bile duct dilatation. No common duct stones are identified. Electronically Signed   By: Burman Nieves M.D.   On: 07/30/2022 00:08   MR 3D Recon At Scanner  Result Date: 07/30/2022 CLINICAL DATA:  Cholelithiasis EXAM: MRI ABDOMEN WITHOUT AND WITH CONTRAST (INCLUDING MRCP) TECHNIQUE: Multiplanar multisequence MR imaging of the abdomen was performed both before and after the administration of intravenous contrast. Heavily T2-weighted images of the biliary and pancreatic ducts were obtained, and three-dimensional MRCP images were rendered by post processing. CONTRAST:  9.1mL GADAVIST GADOBUTROL 1 MMOL/ML IV SOLN COMPARISON:  Ultrasound 07/29/2022 FINDINGS: Lower chest: No pleural effusions or focal consolidation. Hepatobiliary: Cholelithiasis with several stones in the gallbladder. Mild pericholecystic  edema. Changes are consistent with acute cholecystitis in the appropriate clinical setting. No intra or extrahepatic bile duct dilatation. No filling defects in the common bile duct. No focal liver lesions. Pancreas: No mass, inflammatory changes, or other parenchymal abnormality identified. Spleen:  Within normal limits in size and appearance. Adrenals/Urinary Tract: No masses identified. No evidence of hydronephrosis. Stomach/Bowel: Visualized portions within the abdomen are unremarkable. Vascular/Lymphatic: No pathologically enlarged lymph nodes identified. No abdominal aortic aneurysm demonstrated. Other: No free fluid demonstrated in the upper abdomen. Visualized abdominal wall musculature appears intact. Musculoskeletal: No suspicious bone lesions identified. IMPRESSION: 1. Cholelithiasis with pericholecystic edema. 2. No intra or extrahepatic bile duct dilatation. No common duct stones are identified. Electronically Signed   By: Burman Nieves M.D.   On: 07/30/2022 00:08   US Abdomen Limited RUQ (LIVER/GB)  Result Date: 07/29/2022 CLINICAL DATA:  Right upper quadrant pain. EXAM: ULTRASOUND ABDOMEN LIMITED RIGHT UPPER QUADRANT COMPARISON:  None Available. FINDINGS: Gallbladder: Stones and sludge are present within the gallbladder and there is mild gallbladder wall thickening at 4 mm. A sonographic Eulah Pont sign is noted by sonographer. Common bile duct: Diameter: 5 mm Liver: No focal lesion identified. Within normal limits in parenchymal echogenicity. Portal vein is patent on color Doppler imaging with normal direction of blood flow towards the liver. Other: No free fluid. IMPRESSION: Cholelithiasis and gallbladder sludge with gallbladder wall thickening and positive Murphy sign, suggestive of acute cholecystitis. Electronically Signed   By: Thornell Sartorius M.D.   On: 07/29/2022 20:05        Scheduled Meds:  acetaminophen  650 mg Oral  Q6H   busPIRone  10 mg Oral BID   fentaNYL       fentaNYL        sodium chloride flush  3 mL Intravenous Q12H   Continuous Infusions:  acetaminophen     ceFAZolin       LOS: 1 day    Time spent: 51 minutes spent on chart review, discussion with nursing staff, consultants, updating family and interview/physical exam; more than 50% of that time was spent in counseling and/or coordination of care.    Alvira Philips [place], DO [facility] Available via Epic secure chat 7am-7pm After these hours, please refer to coverage provider listed on [email or web address] 07/30/2022, 5:36 PM

## 2022-07-30 NOTE — Anesthesia Postprocedure Evaluation (Signed)
Anesthesia Post Note  Patient: Scott Watts  Procedure(s) Performed: LAPAROSCOPIC CHOLECYSTECTOMY WITH INTRAOPERATIVE CHOLANGIOGRAM (Abdomen)     Patient location during evaluation: PACU Anesthesia Type: General Level of consciousness: awake and alert Pain management: pain level controlled Vital Signs Assessment: post-procedure vital signs reviewed and stable Respiratory status: spontaneous breathing, nonlabored ventilation and respiratory function stable Cardiovascular status: blood pressure returned to baseline and stable Postop Assessment: no apparent nausea or vomiting Anesthetic complications: no   No notable events documented.  Last Vitals:  Vitals:   07/30/22 1430 07/30/22 1452  BP: (!) 166/91 (!) 167/92  Pulse: 72 77  Resp: 20 20  Temp: (!) 36.4 C 36.7 C  SpO2: 93% 95%    Last Pain:  Vitals:   07/30/22 1452  TempSrc: Oral  PainSc:                  Kina Shiffman

## 2022-07-30 NOTE — Anesthesia Procedure Notes (Signed)
Procedure Name: Intubation Date/Time: 07/30/2022 12:38 PM  Performed by: Deliah Boston, CRNAPre-anesthesia Checklist: Patient identified, Emergency Drugs available, Suction available and Patient being monitored Patient Re-evaluated:Patient Re-evaluated prior to induction Oxygen Delivery Method: Circle system utilized Preoxygenation: Pre-oxygenation with 100% oxygen Induction Type: IV induction Ventilation: Mask ventilation without difficulty Laryngoscope Size: Mac and 4 Grade View: Grade II Tube type: Oral Number of attempts: 1 Airway Equipment and Method: Stylet Placement Confirmation: ETT inserted through vocal cords under direct vision, positive ETCO2 and breath sounds checked- equal and bilateral Secured at: 21 cm Tube secured with: Tape Dental Injury: Teeth and Oropharynx as per pre-operative assessment

## 2022-07-30 NOTE — Consult Note (Signed)
Reason for Consult: Abdominal pain Referring Physician: Dr. [place]  Scott Watts is an 41 y.o. male.  HPI: Patient is a 41 year old male, who comes in secondary to abdominal pain.  He states that over the last 6 months he had epigastric abdominal pain that radiates to the back.  He states usually this associated nausea vomiting.  He does have some muscle relaxants that he feels helps with the discomfort.  Patient was recently in the ER at [facility] underwent work-up and was found to have higher than normal bilirubin as well as a gallstones within the gallbladder.  Patient was concerned about financial issues.  Patient continues with abdominal pain, nausea vomiting.  Pain continues to be in the epigastrium and migrated to the back.  Patient underwent work-up per EDP.  Patient found an elevated T. bili of 4.4.  Patient also with gallstone in the gallbladder.  Patient underwent MRI which was negative for choledocholithiasis.  Of note patient does state that he has had elevated T. bili in the past.  I do see the up through May of last year he has had a T. bili of 2.1, 4.0 and today 4.4.  Past Medical History:  Diagnosis Date   Elevated bilirubin    Low serum vitamin B12     Past Surgical History:  Procedure Laterality Date   COLONOSCOPY  2014   CONDYLOMA EXCISION/FULGURATION     orthodontics surgery      Family History  Problem Relation Age of Onset   Cerebral aneurysm Mother    Alcohol abuse Father    Cancer Neg Hx     Social History:  reports that he has been smoking cigarettes. He has been smoking an average of .5 packs per day. He has never used smokeless tobacco. He reports current alcohol use of about 2.0 standard drinks of alcohol per week. He reports that he does not use drugs.  Allergies: No Known Allergies  Medications: I have reviewed the patient's current medications.  Results for orders placed or performed during the hospital encounter of 07/29/22 (from the past 48  hour(s))  Urinalysis, Routine w reflex microscopic Urine, Clean Catch     Status: Abnormal   Collection Time: 07/29/22  6:50 PM  Result Value Ref Range   Color, Urine AMBER (A) YELLOW    Comment: BIOCHEMICALS MAY BE AFFECTED BY COLOR   APPearance CLEAR CLEAR   Specific Gravity, Urine 1.027 1.005 - 1.030   pH 5.0 5.0 - 8.0   Glucose, UA NEGATIVE NEGATIVE mg/dL   Hgb urine dipstick NEGATIVE NEGATIVE   Bilirubin Urine NEGATIVE NEGATIVE   Ketones, ur NEGATIVE NEGATIVE mg/dL   Protein, ur NEGATIVE NEGATIVE mg/dL   Nitrite NEGATIVE NEGATIVE   Leukocytes,Ua NEGATIVE NEGATIVE    Comment: Performed at [facility], [place] [address], [place], [place] [address]  Lipase, blood     Status: None   Collection Time: 07/29/22  7:28 PM  Result Value Ref Range   Lipase 28 11 - 51 U/L    Comment: Performed at [facility], [place] [address] [place], [place] [address]  Comprehensive metabolic panel     Status: Abnormal   Collection Time: 07/29/22  7:28 PM  Result Value Ref Range   Sodium 141 135 - 145 mmol/L   Potassium 3.9 3.5 - 5.1 mmol/L   Chloride 111 98 - 111 mmol/L   CO2 23 22 - 32 mmol/L   Glucose, Bld 96 70 - 99 mg/dL  Comment: Glucose reference range applies only to samples taken after fasting for at least 8 hours.   BUN 18 6 - 20 mg/dL   Creatinine, Ser 1.13 0.61 - 1.24 mg/dL   Calcium 9.3 8.9 - 10.3 mg/dL   Total Protein 7.5 6.5 - 8.1 g/dL   Albumin 4.9 3.5 - 5.0 g/dL   AST 17 15 - 41 U/L   ALT 19 0 - 44 U/L   Alkaline Phosphatase 58 38 - 126 U/L   Total Bilirubin 4.4 (H) 0.3 - 1.2 mg/dL   GFR, Estimated >60 >60 mL/min    Comment: (NOTE) Calculated using the CKD-EPI Creatinine Equation (2021)    Anion gap 7 5 - 15    Comment: Performed at [facility], [place] [address] [place], [place] [address]  CBC     Status: Abnormal   Collection Time: 07/29/22  7:28 PM  Result Value Ref Range   WBC 8.3 4.0 - 10.5 K/uL   RBC 3.97 (L)  4.22 - 5.81 MIL/uL   Hemoglobin 13.3 13.0 - 17.0 g/dL   HCT 41.5 39.0 - 52.0 %   MCV 104.5 (H) 80.0 - 100.0 fL   MCH 33.5 26.0 - 34.0 pg   MCHC 32.0 30.0 - 36.0 g/dL   RDW 13.1 11.5 - 15.5 %   Platelets 163 150 - 400 K/uL   nRBC 0.0 0.0 - 0.2 %    Comment: Performed at [facility], [place] [address] [place], [place] [address]    MR ABDOMEN MRCP W WO CONTAST  Result Date: 07/30/2022 CLINICAL DATA:  Cholelithiasis EXAM: MRI ABDOMEN WITHOUT AND WITH CONTRAST (INCLUDING MRCP) TECHNIQUE: Multiplanar multisequence MR imaging of the abdomen was performed both before and after the administration of intravenous contrast. Heavily T2-weighted images of the biliary and pancreatic ducts were obtained, and three-dimensional MRCP images were rendered by post processing. CONTRAST:  9.1mL GADAVIST GADOBUTROL 1 MMOL/ML IV SOLN COMPARISON:  Ultrasound 07/29/2022 FINDINGS: Lower chest: No pleural effusions or focal consolidation. Hepatobiliary: Cholelithiasis with several stones in the gallbladder. Mild pericholecystic edema. Changes are consistent with acute cholecystitis in the appropriate clinical setting. No intra or extrahepatic bile duct dilatation. No filling defects in the common bile duct. No focal liver lesions. Pancreas: No mass, inflammatory changes, or other parenchymal abnormality identified. Spleen:  Within normal limits in size and appearance. Adrenals/Urinary Tract: No masses identified. No evidence of hydronephrosis. Stomach/Bowel: Visualized portions within the abdomen are unremarkable. Vascular/Lymphatic: No pathologically enlarged lymph nodes identified. No abdominal aortic aneurysm demonstrated. Other: No free fluid demonstrated in the upper abdomen. Visualized abdominal wall musculature appears intact. Musculoskeletal: No suspicious bone lesions identified. IMPRESSION: 1. Cholelithiasis with pericholecystic edema. 2. No intra or extrahepatic bile duct dilatation. No common duct  stones are identified. Electronically Signed   By: Lucienne Capers M.D.   On: 07/30/2022 00:08   MR 3D Recon At Scanner  Result Date: 07/30/2022 CLINICAL DATA:  Cholelithiasis EXAM: MRI ABDOMEN WITHOUT AND WITH CONTRAST (INCLUDING MRCP) TECHNIQUE: Multiplanar multisequence MR imaging of the abdomen was performed both before and after the administration of intravenous contrast. Heavily T2-weighted images of the biliary and pancreatic ducts were obtained, and three-dimensional MRCP images were rendered by post processing. CONTRAST:  9.42mL GADAVIST GADOBUTROL 1 MMOL/ML IV SOLN COMPARISON:  Ultrasound 07/29/2022 FINDINGS: Lower chest: No pleural effusions or focal consolidation. Hepatobiliary: Cholelithiasis with several stones in the gallbladder. Mild pericholecystic edema. Changes are consistent with acute cholecystitis in the appropriate clinical setting. No intra  or extrahepatic bile duct dilatation. No filling defects in the common bile duct. No focal liver lesions. Pancreas: No mass, inflammatory changes, or other parenchymal abnormality identified. Spleen:  Within normal limits in size and appearance. Adrenals/Urinary Tract: No masses identified. No evidence of hydronephrosis. Stomach/Bowel: Visualized portions within the abdomen are unremarkable. Vascular/Lymphatic: No pathologically enlarged lymph nodes identified. No abdominal aortic aneurysm demonstrated. Other: No free fluid demonstrated in the upper abdomen. Visualized abdominal wall musculature appears intact. Musculoskeletal: No suspicious bone lesions identified. IMPRESSION: 1. Cholelithiasis with pericholecystic edema. 2. No intra or extrahepatic bile duct dilatation. No common duct stones are identified. Electronically Signed   By: Lucienne Capers M.D.   On: 07/30/2022 00:08   US Abdomen Limited RUQ (LIVER/GB)  Result Date: 07/29/2022 CLINICAL DATA:  Right upper quadrant pain. EXAM: ULTRASOUND ABDOMEN LIMITED RIGHT UPPER QUADRANT  COMPARISON:  None Available. FINDINGS: Gallbladder: Stones and sludge are present within the gallbladder and there is mild gallbladder wall thickening at 4 mm. A sonographic Percell Miller sign is noted by sonographer. Common bile duct: Diameter: 5 mm Liver: No focal lesion identified. Within normal limits in parenchymal echogenicity. Portal vein is patent on color Doppler imaging with normal direction of blood flow towards the liver. Other: No free fluid. IMPRESSION: Cholelithiasis and gallbladder sludge with gallbladder wall thickening and positive Murphy sign, suggestive of acute cholecystitis. Electronically Signed   By: Brett Fairy M.D.   On: 07/29/2022 20:05    Review of Systems  Constitutional: Negative.   HENT: Negative.    Eyes: Negative.   Respiratory: Negative.    Cardiovascular: Negative.   Gastrointestinal: Negative.   Endocrine: Negative.   Neurological: Negative.    Blood pressure 119/70, pulse 69, temperature 98.4 F (36.9 C), temperature source Oral, resp. rate 16, height 6\' 3"  (1.905 m), weight 94.3 kg, SpO2 92 %. Physical Exam Constitutional:      Appearance: He is well-developed.     Comments: Conversant No acute distress  HENT:     Head: Normocephalic and atraumatic.  Eyes:     General: Lids are normal. No scleral icterus.    Pupils: Pupils are equal, round, and reactive to light.     Comments: Pupils are equal round and reactive No lid lag Moist conjunctiva  Neck:     Thyroid: No thyromegaly.     Trachea: No tracheal tenderness.     Comments: No cervical lymphadenopathy Cardiovascular:     Rate and Rhythm: Normal rate and regular rhythm.     Heart sounds: No murmur heard. Pulmonary:     Effort: Pulmonary effort is normal.     Breath sounds: Normal breath sounds. No wheezing or rales.  Abdominal:     Tenderness: There is abdominal tenderness in the epigastric area.     Hernia: No hernia is present.  Musculoskeletal:     Cervical back: Normal range of motion and  neck supple.  Skin:    General: Skin is warm.     Findings: No rash.     Nails: There is no clubbing.     Comments: Normal skin turgor  Neurological:     Mental Status: He is alert and oriented to person, place, and time.     Comments: Normal gait and station  Psychiatric:        Mood and Affect: Mood normal.        Thought Content: Thought content normal.        Judgment: Judgment normal.     Comments: Appropriate  affect     Assessment/Plan: 41 year old male with cholelithiasis likely passing choledocholithiasis.  Patient with hyperbilirubinemia.  At this time we will proceed to the operating for lap chole with IOC. All risks and benefits were discussed with the patient to generally include: infection, bleeding, possible need for post op ERCP, damage to the bile ducts, and bile leak. Alternatives were offered and described.  All questions were answered and the patient voiced understanding of the procedure and wishes to proceed at this point with a laparoscopic cholecystectomy   Ralene Ok 07/30/2022, 8:44 AM

## 2022-07-30 NOTE — Op Note (Signed)
07/30/2022  1:23 PM  PATIENT:  Scott Watts  41 y.o. male  PRE-OPERATIVE DIAGNOSIS:  Cholelithiasis  POST-OPERATIVE DIAGNOSIS:  acute cholecystitis, Cholelithiasis  PROCEDURE:  Procedure(s): LAPAROSCOPIC CHOLECYSTECTOMY WITH INTRAOPERATIVE CHOLANGIOGRAM (N/A)  SURGEON:  Surgeon(s) and Role:    * Ralene Ok, MD - Primary  ANESTHESIA:   local and general  EBL:  5 mL   BLOOD ADMINISTERED:none  DRAINS: none   LOCAL MEDICATIONS USED:  BUPIVICAINE   SPECIMEN:  Source of Specimen:  gallbladder  DISPOSITION OF SPECIMEN:  PATHOLOGY  COUNTS:  YES  TOURNIQUET:  * No tourniquets in log *  DICTATION: .Dragon Dictation  Findings: chronically inflamed gallbladder  The patient was taken to the operating and placed in the supine position with bilateral SCDs in place.  The patient was prepped and draped in the usual sterile fashion. A time out was called and all facts were verified. A pneumoperitoneum was obtained via A Veress needle technique to a pressure of 35mm of mercury.  A 21mm trochar was then placed in the right upper quadrant under visualization, and there were no injuries to any abdominal organs. A 11 mm port was then placed in the umbilical region after infiltrating with local anesthesia under direct visualization. A second and third epigastric port and right lower quadrant port placement under direct visualization, respectively.     The gallbladder was identified and retracted, the peritoneum was then sharply dissected from the gallbladder and this dissection was carried down to Calot's triangle. The cystic duct was identified and stripped away circumferentially and seen going into the gallbladder 360, the critical angle was obtained. A Cook catheter was used to perform an intraoperative cholangiogram. The cholangiogram showed no filling defects and the contrast emptied into the duodenum easily.  The hepatic ducts were seen to be free of any filling defects. 2 clips were  placed proximally one distally and the cystic duct transected. The cystic artery was identified and 2 clips placed proximally and one distally and transected.    We then proceeded to remove the gallbladder off the hepatic fossa with Bovie cautery. A retrieval bag was then placed in the abdomen and gallbladder placed in the bag. The hepatic fossa was then reexamined and hemostasis was achieved with Bovie cautery and was excellent at the end of the case. The subhepatic fossa and perihepatic fossa was then irrigated until the effluent was clear. The 11 mm trocar fascia was reapproximated with the PMI & a #1 Vicryl x2.  The pneumoperitoneum was evacuated and all trochars removed under direct visulalization.  The skin was then closed with 4-0 Monocryl and the skin dressed with Dermabond.  The patient was awaken from general anesthesia and taken to the recovery room in stable condition.    PLAN OF CARE: Admit for overnight observation  PATIENT DISPOSITION:  PACU - hemodynamically stable.   Delay start of Pharmacological VTE agent (>24hrs) due to surgical blood loss or risk of bleeding: not applicable

## 2022-07-31 ENCOUNTER — Encounter (HOSPITAL_COMMUNITY): Payer: Self-pay | Admitting: General Surgery

## 2022-07-31 LAB — COMPREHENSIVE METABOLIC PANEL
ALT: 31 U/L (ref 0–44)
AST: 27 U/L (ref 15–41)
Albumin: 5 g/dL (ref 3.5–5.0)
Alkaline Phosphatase: 60 U/L (ref 38–126)
Anion gap: 10 (ref 5–15)
BUN: 16 mg/dL (ref 6–20)
CO2: 24 mmol/L (ref 22–32)
Calcium: 10 mg/dL (ref 8.9–10.3)
Chloride: 105 mmol/L (ref 98–111)
Creatinine, Ser: 1.04 mg/dL (ref 0.61–1.24)
GFR, Estimated: >60 mL/min (ref >60)
Glucose, Bld: 103 mg/dL — ABNORMAL HIGH (ref 70–99)
Potassium: 4.5 mmol/L (ref 3.5–5.1)
Sodium: 139 mmol/L (ref 135–145)
Total Bilirubin: 3.7 mg/dL — ABNORMAL HIGH (ref 0.3–1.2)
Total Protein: 7.6 g/dL (ref 6.5–8.1)

## 2022-07-31 LAB — SURGICAL PATHOLOGY

## 2022-07-31 MED ORDER — POLYETHYLENE GLYCOL 3350 17 G PO PACK
17.0000 g | PACK | Freq: Once | ORAL | Status: AC
  Administered 2022-07-31: 17 g via ORAL
  Filled 2022-07-31: qty 1

## 2022-07-31 MED ORDER — OXYCODONE HCL 5 MG PO TABS: 5.0000 mg | ORAL_TABLET | Freq: Four times a day (QID) | ORAL | 0 refills | Status: DC | PRN

## 2022-07-31 MED ORDER — ONDANSETRON HCL 4 MG PO TABS: 4.0000 mg | ORAL_TABLET | Freq: Three times a day (TID) | ORAL | 0 refills | Status: DC | PRN

## 2022-07-31 NOTE — Discharge Summary (Signed)
Physician Discharge Summary  Scott RuddyJustin T Twiford OZH:[id number] DOB: [birth year] DOA: 07/29/2022  PCP: Ardith DarkParker, Caleb M, MD  Admit date: 07/29/2022 Discharge date: 07/31/2022  Admitted From:  Disposition:    Recommendations for Outpatient Follow-up:  Follow up with PCP in 1-2 weeks Follow-up with general surgery as scheduled on 08/14/2022 Amatory referral placed to gastroenterology for elevated bilirubin per request by general surgery Please obtain CMP in 1 week to reassess bilirubin level   Home Health: No Equipment/Devices: None  Discharge Condition: Able CODE STATUS: Full code Diet recommendation: Heart healthy diet  History of present illness:  Scott Watts is a 41 y.o. male with past medical history significant for alopecia, anxiety, insomnia who presented to [facility] ED on 10/24 with abdominal pain associated with nausea and vomiting.  Patient reports he recently went [facility] ED and was told he needed surgery to have his gallbladder removed but he declined and left due to being uninsured and having no idea what the cost would be.  Pain has been present for the last several months and reported as intermittent.  Localized to the right upper quadrant and epigastric region.  Now becoming more intense and constant.  Denied fever, no chills, no chest pain, no shortness of breath, no diarrhea.   In the ED, temperature 98.1 F, HR 86, RR 18, BP 147/87, SPO2 90% on room air.  WBC 8.3, hemoglobin 13.3, platelets 163.  Sodium 141, potassium 3.9, chloride 111, CO2 23, glucose 96, BUN 18, creatinine 1.13.  Lipase 28.  AST 17, ALT 19, total bilirubin 4.4.  Urinalysis unrevealing.  Right upper quadrant ultrasound with cholelithiasis and gallbladder sludge with gallbladder wall thickening and positive Murphy sign suggestive of acute cholecystitis.  MRCP with cholelithiasis with pericholecystic edema, no intra or extrahepatic bile duct dilation, no CBD stones were identified.  General surgery  was consulted.  Hospital service consulted for further evaluation and management.  Hospital course:  Acute cholecystitis Patient presenting with persistent and progressive abdominal pain associated with nausea and vomiting.  Patient is afebrile without leukocytosis. Right upper quadrant ultrasound with cholelithiasis and gallbladder sludge with gallbladder wall thickening and positive Murphy sign suggestive of acute cholecystitis.  MRCP with cholelithiasis with pericholecystic edema, no intra or extrahepatic bile duct dilation, no CBD stones were identified.  General surgery was consulted and patient underwent laparoscopic cholecystectomy with intraoperative cholangiogram by Dr. Thornell SartoriusMarris on 07/30/2022 with findings of chronically inflamed gallbladder.  Patient's diet was advanced with toleration.  Outpatient follow-up as scheduled with general surgery.  Elevated bilirubin Bilirubin slightly improved at discharge.  General surgery requests referral to gastroenterology for further evaluation.  Recommend repeat CMP 1 week.  Anxiety Insomnia Continue BuSpar, Klonopin, Hydroxyzine  Discharge Diagnoses:  Principal Problem:   Acute cholecystitis Active Problems:   Anxiety state   Insomnia, unspecified    Discharge Instructions  Discharge Instructions     Ambulatory referral to Gastroenterology   Complete by: As directed    Elevated bilirubin.  Underwent laparoscopic cholecystectomy 10/25 with IOC.  General surgery requesting outpatient follow-up with GI for evaluation.   What is the reason for referral?: Other   Call MD for:  difficulty breathing, headache or visual disturbances   Complete by: As directed    Call MD for:  extreme fatigue   Complete by: As directed    Call MD for:  persistant dizziness or light-headedness   Complete by: As directed    Call MD for:  persistant nausea and vomiting  Complete by: As directed    Call MD for:  severe uncontrolled pain   Complete by: As  directed    Call MD for:  temperature >100.4   Complete by: As directed    Diet - low sodium heart healthy   Complete by: As directed    Increase activity slowly   Complete by: As directed       Allergies as of 07/31/2022   No Known Allergies      Medication List     STOP taking these medications    Albuterol Sulfate 108 (90 Base) MCG/ACT Aepb Commonly known as: PROAIR RESPICLICK   amoxicillin 875 MG tablet Commonly known as: AMOXIL   baclofen 10 MG tablet Commonly known as: LIORESAL   diclofenac 75 MG EC tablet Commonly known as: VOLTAREN   traZODone 50 MG tablet Commonly known as: DESYREL       TAKE these medications    busPIRone 10 MG tablet Commonly known as: BUSPAR TAKE ONE TABLET BY MOUTH TWICE A DAY   clonazePAM 1 MG tablet Commonly known as: KLONOPIN TAKE 0.5 TO 1 TABLET BY MOUTH TWO TIMES A DAY AS NEEDED FOR ANXIETY What changed: See the new instructions.   COLLAGEN EX Apply 1 Application topically daily.   hydrOXYzine 25 MG tablet Commonly known as: ATARAX TAKE 1 TO 2 TABLETS BY MOUTH EVERY NIGHT AT BEDTIME AS NEEDED FOR INSOMNIA What changed: See the new instructions.   ibuprofen 200 MG tablet Commonly known as: ADVIL Take 200 mg by mouth every 6 (six) hours as needed for headache or mild pain.   ondansetron 4 MG tablet Commonly known as: Zofran Take 1 tablet (4 mg total) by mouth every 8 (eight) hours as needed for nausea or vomiting.   oxyCODONE 5 MG immediate release tablet Commonly known as: Roxicodone Take 1 tablet (5 mg total) by mouth every 6 (six) hours as needed for moderate pain.   tadalafil 20 MG tablet Commonly known as: CIALIS TAKE 1 TABLET BY MOUTH DAILY AS NEEDED FOR ERECTILE DYSFUNCTION What changed: See the new instructions.        Follow-up Information     Maczis, Hedda Slade, PA-C Follow up on 08/14/2022.   Specialty: General Surgery Why: at 4:00 PM for follow up from recent surgery. please arrive 30 minutes  early to your appointment to get checked in and fill out any necessary paperwork. Contact information: [address] [place] [address] [phone]         Ardith Dark, MD. Schedule an appointment as soon as possible for a visit in 1 week(s).   Specialty: Family Medicine Contact information: [address] [place] [address] [phone]         [facility]. Schedule an appointment as soon as possible for a visit.   Specialty: Gastroenterology Contact information: [address] [place] [address] [phone]               No Known Allergies  Consultations: General surgery   Procedures/Studies: DG Cholangiogram Operative  Result Date: 07/30/2022 CLINICAL DATA:  Intraoperative cholangiogram during laparoscopic cholecystectomy. EXAM: INTRAOPERATIVE CHOLANGIOGRAM FLUOROSCOPY TIME:  23 seconds (14.3 mGy) COMPARISON:  None Available. FINDINGS: Intraoperative cholangiographic images of the right upper abdominal quadrant during laparoscopic cholecystectomy are provided for review. Surgical clips overlie the expected location of the gallbladder fossa. Contrast injection demonstrates selective cannulation of the central aspect of the cystic duct. There is passage of contrast through the central  aspect of the cystic duct with filling of a non dilated common bile duct. There is passage of contrast though the CBD and into the descending portion of the duodenum. There is minimal reflux of injected contrast into the common hepatic duct and central aspect of the non dilated intrahepatic biliary system. There is suspected faint opacification of the central most aspect of the pancreatic duct just superior to the level of the ampulla. There are no discrete filling defects within the opacified portions of the biliary system to suggest the presence of choledocholithiasis. IMPRESSION: No evidence of choledocholithiasis. Electronically  Signed   By: Simonne Come M.D.   On: 07/30/2022 14:00   MR ABDOMEN MRCP W WO CONTAST  Result Date: 07/30/2022 CLINICAL DATA:  Cholelithiasis EXAM: MRI ABDOMEN WITHOUT AND WITH CONTRAST (INCLUDING MRCP) TECHNIQUE: Multiplanar multisequence MR imaging of the abdomen was performed both before and after the administration of intravenous contrast. Heavily T2-weighted images of the biliary and pancreatic ducts were obtained, and three-dimensional MRCP images were rendered by post processing. CONTRAST:  9.54mL GADAVIST GADOBUTROL 1 MMOL/ML IV SOLN COMPARISON:  Ultrasound 07/29/2022 FINDINGS: Lower chest: No pleural effusions or focal consolidation. Hepatobiliary: Cholelithiasis with several stones in the gallbladder. Mild pericholecystic edema. Changes are consistent with acute cholecystitis in the appropriate clinical setting. No intra or extrahepatic bile duct dilatation. No filling defects in the common bile duct. No focal liver lesions. Pancreas: No mass, inflammatory changes, or other parenchymal abnormality identified. Spleen:  Within normal limits in size and appearance. Adrenals/Urinary Tract: No masses identified. No evidence of hydronephrosis. Stomach/Bowel: Visualized portions within the abdomen are unremarkable. Vascular/Lymphatic: No pathologically enlarged lymph nodes identified. No abdominal aortic aneurysm demonstrated. Other: No free fluid demonstrated in the upper abdomen. Visualized abdominal wall musculature appears intact. Musculoskeletal: No suspicious bone lesions identified. IMPRESSION: 1. Cholelithiasis with pericholecystic edema. 2. No intra or extrahepatic bile duct dilatation. No common duct stones are identified. Electronically Signed   By: Burman Nieves M.D.   On: 07/30/2022 00:08   MR 3D Recon At Scanner  Result Date: 07/30/2022 CLINICAL DATA:  Cholelithiasis EXAM: MRI ABDOMEN WITHOUT AND WITH CONTRAST (INCLUDING MRCP) TECHNIQUE: Multiplanar multisequence MR imaging of the abdomen  was performed both before and after the administration of intravenous contrast. Heavily T2-weighted images of the biliary and pancreatic ducts were obtained, and three-dimensional MRCP images were rendered by post processing. CONTRAST:  9.97mL GADAVIST GADOBUTROL 1 MMOL/ML IV SOLN COMPARISON:  Ultrasound 07/29/2022 FINDINGS: Lower chest: No pleural effusions or focal consolidation. Hepatobiliary: Cholelithiasis with several stones in the gallbladder. Mild pericholecystic edema. Changes are consistent with acute cholecystitis in the appropriate clinical setting. No intra or extrahepatic bile duct dilatation. No filling defects in the common bile duct. No focal liver lesions. Pancreas: No mass, inflammatory changes, or other parenchymal abnormality identified. Spleen:  Within normal limits in size and appearance. Adrenals/Urinary Tract: No masses identified. No evidence of hydronephrosis. Stomach/Bowel: Visualized portions within the abdomen are unremarkable. Vascular/Lymphatic: No pathologically enlarged lymph nodes identified. No abdominal aortic aneurysm demonstrated. Other: No free fluid demonstrated in the upper abdomen. Visualized abdominal wall musculature appears intact. Musculoskeletal: No suspicious bone lesions identified. IMPRESSION: 1. Cholelithiasis with pericholecystic edema. 2. No intra or extrahepatic bile duct dilatation. No common duct stones are identified. Electronically Signed   By: Burman Nieves M.D.   On: 07/30/2022 00:08   US Abdomen Limited RUQ (LIVER/GB)  Result Date: 07/29/2022 CLINICAL DATA:  Right upper quadrant pain. EXAM: ULTRASOUND ABDOMEN LIMITED RIGHT UPPER QUADRANT COMPARISON:  None Available. FINDINGS: Gallbladder: Stones and sludge are present within the gallbladder and there is mild gallbladder wall thickening at 4 mm. A sonographic Eulah Pont sign is noted by sonographer. Common bile duct: Diameter: 5 mm Liver: No focal lesion identified. Within normal limits in parenchymal  echogenicity. Portal vein is patent on color Doppler imaging with normal direction of blood flow towards the liver. Other: No free fluid. IMPRESSION: Cholelithiasis and gallbladder sludge with gallbladder wall thickening and positive Murphy sign, suggestive of acute cholecystitis. Electronically Signed   By: Thornell Sartorius M.D.   On: 07/29/2022 20:05     Subjective: Patient seen and examined at bedside, resting comfortably.  Tolerating diet.  Seen by general surgery okay for discharge home with outpatient follow-up scheduled.  Continues to report some gas pain and mild abdominal discomfort at surgical site; otherwise no other questions or concerns at this time.  Denies headache, no dizziness, no chest pain, no palpitations, no shortness of breath, no fever/chills/night sweats, no nausea/vomiting/diarrhea, no focal weakness, no fatigue, no paresthesias.  No acute events overnight per nursing staff.  Discharge Exam: Vitals:   07/31/22 0133 07/31/22 0548  BP: (!) 146/89 (!) 153/88  Pulse: 89 78  Resp: 17 18  Temp: 98.6 F (37 C) 97.9 F (36.6 C)  SpO2: 94% 93%   Vitals:   07/30/22 1743 07/30/22 2124 07/31/22 0133 07/31/22 0548  BP: (!) 152/86 (!) 150/97 (!) 146/89 (!) 153/88  Pulse: 73 76 89 78  Resp: 18 18 17 18   Temp: 98.9 F (37.2 C) 98.3 F (36.8 C) 98.6 F (37 C) 97.9 F (36.6 C)  TempSrc:  Oral Oral Oral  SpO2: 92% 92% 94% 93%  Weight:      Height:        Physical Exam: GEN: NAD, alert and oriented x 3, wd/wn HEENT: NCAT, PERRL, EOMI, sclera clear, MMM PULM: CTAB w/o wheezes/crackles, normal respiratory effort CV: RRR w/o M/G/R GI: abd soft, NTND, NABS, no R/G/M, surgical sites noted MSK: no peripheral edema, muscle strength globally intact 5/5 bilateral upper/lower extremities NEURO: CN II-XII intact, no focal deficits, sensation to light touch intact PSYCH: Anxious mood, normal affect Integumentary: dry/intact, no rashes or wounds    The results of significant  diagnostics from this hospitalization (including imaging, microbiology, ancillary and laboratory) are listed below for reference.     Microbiology: No results found for this or any previous visit (from the past 240 hour(s)).   Labs: BNP (last 3 results) No results for input(s): "BNP" in the last 8760 hours. Basic Metabolic Panel: Recent Labs  Lab 07/29/22 1928 07/31/22 0422  NA 141 139  K 3.9 4.5  CL 111 105  CO2 23 24  GLUCOSE 96 103*  BUN 18 16  CREATININE 1.13 1.04  CALCIUM 9.3 10.0   Liver Function Tests: Recent Labs  Lab 07/29/22 1928 07/31/22 0422  AST 17 27  ALT 19 31  ALKPHOS 58 60  BILITOT 4.4* 3.7*  PROT 7.5 7.6  ALBUMIN 4.9 5.0   Recent Labs  Lab 07/29/22 1928  LIPASE 28   No results for input(s): "AMMONIA" in the last 168 hours. CBC: Recent Labs  Lab 07/29/22 1928  WBC 8.3  HGB 13.3  HCT 41.5  MCV 104.5*  PLT 163   Cardiac Enzymes: No results for input(s): "CKTOTAL", "CKMB", "CKMBINDEX", "TROPONINI" in the last 168 hours. BNP: Invalid input(s): "POCBNP" CBG: No results for input(s): "GLUCAP" in the last 168 hours. D-Dimer No results for input(s): "DDIMER" in  the last 72 hours. Hgb A1c No results for input(s): "HGBA1C" in the last 72 hours. Lipid Profile No results for input(s): "CHOL", "HDL", "LDLCALC", "TRIG", "CHOLHDL", "LDLDIRECT" in the last 72 hours. Thyroid function studies No results for input(s): "TSH", "T4TOTAL", "T3FREE", "THYROIDAB" in the last 72 hours.  Invalid input(s): "FREET3" Anemia work up No results for input(s): "VITAMINB12", "FOLATE", "FERRITIN", "TIBC", "IRON", "RETICCTPCT" in the last 72 hours. Urinalysis    Component Value Date/Time   COLORURINE AMBER (A) 07/29/2022 1850   APPEARANCEUR CLEAR 07/29/2022 1850   LABSPEC 1.027 07/29/2022 1850   PHURINE 5.0 07/29/2022 1850   GLUCOSEU NEGATIVE 07/29/2022 1850   HGBUR NEGATIVE 07/29/2022 1850   BILIRUBINUR NEGATIVE 07/29/2022 1850   KETONESUR NEGATIVE  07/29/2022 1850   PROTEINUR NEGATIVE 07/29/2022 1850   NITRITE NEGATIVE 07/29/2022 1850   LEUKOCYTESUR NEGATIVE 07/29/2022 1850   Sepsis Labs Recent Labs  Lab 07/29/22 1928  WBC 8.3   Microbiology No results found for this or any previous visit (from the past 240 hour(s)).   Time coordinating discharge: Over 30 minutes  SIGNED:   Elverna Caffee J [place] DO  [facility] 07/31/2022, 9:54 AM

## 2022-07-31 NOTE — Progress Notes (Signed)
1 Day Post-Op   Subjective/Chief Complaint: Doing well this AM [place]    Objective: Vital signs in last 24 hours: Temp:  [97.4 F (36.3 C)-98.9 F (37.2 C)] 97.9 F (36.6 C) (10/26 0548) Pulse Rate:  [72-89] 78 (10/26 0548) Resp:  [13-20] 18 (10/26 0548) BP: (145-167)/(56-97) 153/88 (10/26 0548) SpO2:  [90 %-95 %] 93 % (10/26 0548) Last BM Date : 07/29/22  Intake/Output from previous day: 10/25 0701 - 10/26 0700 In: 1230 [P.O.:480; I.V.:700; IV Piggyback:50] Out: 2475 [Urine:2450; Blood:25] Intake/Output this shift: Total I/O In: -  Out: 200 [Urine:200]  PE:  Constitutional: No acute distress, conversant, appears states age. Eyes: Anicteric sclerae, moist conjunctiva, no lid lag Lungs: Clear to auscultation bilaterally, normal respiratory effort CV: regular rate and rhythm, no murmurs, no peripheral edema, pedal pulses 2+ GI: Soft, no masses or hepatosplenomegaly, non-tender to palpation Skin: No rashes, palpation reveals normal turgor Psychiatric: appropriate judgment and insight, oriented to person, place, and time   Lab Results:  Recent Labs    07/29/22 1928  WBC 8.3  HGB 13.3  HCT 41.5  PLT 163   BMET Recent Labs    07/29/22 1928 07/31/22 0422  NA 141 139  K 3.9 4.5  CL 111 105  CO2 23 24  GLUCOSE 96 103*  BUN 18 16  CREATININE 1.13 1.04  CALCIUM 9.3 10.0   PT/INR No results for input(s): "LABPROT", "INR" in the last 72 hours. ABG No results for input(s): "PHART", "HCO3" in the last 72 hours.  Invalid input(s): "PCO2", "PO2"  Studies/Results: DG Cholangiogram Operative  Result Date: 07/30/2022 CLINICAL DATA:  Intraoperative cholangiogram during laparoscopic cholecystectomy. EXAM: INTRAOPERATIVE CHOLANGIOGRAM FLUOROSCOPY TIME:  23 seconds (14.3 mGy) COMPARISON:  None Available. FINDINGS: Intraoperative cholangiographic images of the right upper abdominal quadrant during laparoscopic cholecystectomy are provided for review. Surgical clips  overlie the expected location of the gallbladder fossa. Contrast injection demonstrates selective cannulation of the central aspect of the cystic duct. There is passage of contrast through the central aspect of the cystic duct with filling of a non dilated common bile duct. There is passage of contrast though the CBD and into the descending portion of the duodenum. There is minimal reflux of injected contrast into the common hepatic duct and central aspect of the non dilated intrahepatic biliary system. There is suspected faint opacification of the central most aspect of the pancreatic duct just superior to the level of the ampulla. There are no discrete filling defects within the opacified portions of the biliary system to suggest the presence of choledocholithiasis. IMPRESSION: No evidence of choledocholithiasis. Electronically Signed   By: Simonne Come M.D.   On: 07/30/2022 14:00   MR ABDOMEN MRCP W WO CONTAST  Result Date: 07/30/2022 CLINICAL DATA:  Cholelithiasis EXAM: MRI ABDOMEN WITHOUT AND WITH CONTRAST (INCLUDING MRCP) TECHNIQUE: Multiplanar multisequence MR imaging of the abdomen was performed both before and after the administration of intravenous contrast. Heavily T2-weighted images of the biliary and pancreatic ducts were obtained, and three-dimensional MRCP images were rendered by post processing. CONTRAST:  9.36mL GADAVIST GADOBUTROL 1 MMOL/ML IV SOLN COMPARISON:  Ultrasound 07/29/2022 FINDINGS: Lower chest: No pleural effusions or focal consolidation. Hepatobiliary: Cholelithiasis with several stones in the gallbladder. Mild pericholecystic edema. Changes are consistent with acute cholecystitis in the appropriate clinical setting. No intra or extrahepatic bile duct dilatation. No filling defects in the common bile duct. No focal liver lesions. Pancreas: No mass, inflammatory changes, or other parenchymal abnormality identified. Spleen:  Within  normal limits in size and appearance. Adrenals/Urinary  Tract: No masses identified. No evidence of hydronephrosis. Stomach/Bowel: Visualized portions within the abdomen are unremarkable. Vascular/Lymphatic: No pathologically enlarged lymph nodes identified. No abdominal aortic aneurysm demonstrated. Other: No free fluid demonstrated in the upper abdomen. Visualized abdominal wall musculature appears intact. Musculoskeletal: No suspicious bone lesions identified. IMPRESSION: 1. Cholelithiasis with pericholecystic edema. 2. No intra or extrahepatic bile duct dilatation. No common duct stones are identified. Electronically Signed   By: Burman Nieves M.D.   On: 07/30/2022 00:08   MR 3D Recon At Scanner  Result Date: 07/30/2022 CLINICAL DATA:  Cholelithiasis EXAM: MRI ABDOMEN WITHOUT AND WITH CONTRAST (INCLUDING MRCP) TECHNIQUE: Multiplanar multisequence MR imaging of the abdomen was performed both before and after the administration of intravenous contrast. Heavily T2-weighted images of the biliary and pancreatic ducts were obtained, and three-dimensional MRCP images were rendered by post processing. CONTRAST:  9.72mL GADAVIST GADOBUTROL 1 MMOL/ML IV SOLN COMPARISON:  Ultrasound 07/29/2022 FINDINGS: Lower chest: No pleural effusions or focal consolidation. Hepatobiliary: Cholelithiasis with several stones in the gallbladder. Mild pericholecystic edema. Changes are consistent with acute cholecystitis in the appropriate clinical setting. No intra or extrahepatic bile duct dilatation. No filling defects in the common bile duct. No focal liver lesions. Pancreas: No mass, inflammatory changes, or other parenchymal abnormality identified. Spleen:  Within normal limits in size and appearance. Adrenals/Urinary Tract: No masses identified. No evidence of hydronephrosis. Stomach/Bowel: Visualized portions within the abdomen are unremarkable. Vascular/Lymphatic: No pathologically enlarged lymph nodes identified. No abdominal aortic aneurysm demonstrated. Other: No free fluid  demonstrated in the upper abdomen. Visualized abdominal wall musculature appears intact. Musculoskeletal: No suspicious bone lesions identified. IMPRESSION: 1. Cholelithiasis with pericholecystic edema. 2. No intra or extrahepatic bile duct dilatation. No common duct stones are identified. Electronically Signed   By: Burman Nieves M.D.   On: 07/30/2022 00:08   US Abdomen Limited RUQ (LIVER/GB)  Result Date: 07/29/2022 CLINICAL DATA:  Right upper quadrant pain. EXAM: ULTRASOUND ABDOMEN LIMITED RIGHT UPPER QUADRANT COMPARISON:  None Available. FINDINGS: Gallbladder: Stones and sludge are present within the gallbladder and there is mild gallbladder wall thickening at 4 mm. A sonographic Eulah Pont sign is noted by sonographer. Common bile duct: Diameter: 5 mm Liver: No focal lesion identified. Within normal limits in parenchymal echogenicity. Portal vein is patent on color Doppler imaging with normal direction of blood flow towards the liver. Other: No free fluid. IMPRESSION: Cholelithiasis and gallbladder sludge with gallbladder wall thickening and positive Murphy sign, suggestive of acute cholecystitis. Electronically Signed   By: Thornell Sartorius M.D.   On: 07/29/2022 20:05    Anti-infectives: Anti-infectives (From admission, onward)    Start     Dose/Rate Route Frequency Ordered Stop   07/30/22 1023  ceFAZolin (ANCEF) 2-4 GM/100ML-% IVPB       Note to Pharmacy: Ramond Craver R: cabinet override      07/30/22 1023 07/30/22 2229   07/30/22 0945  ceFAZolin (ANCEF) powder 2 g  Status:  Discontinued        2 g Other To Surgery 07/30/22 0848 07/30/22 1454   07/29/22 2015  cefTRIAXone (ROCEPHIN) 2 g in sodium chloride 0.9 % 100 mL IVPB        2 g 200 mL/hr over 30 Minutes Intravenous  Once 07/29/22 2014 07/29/22 2152       Assessment/Plan: s/p Procedure(s): LAPAROSCOPIC CHOLECYSTECTOMY WITH INTRAOPERATIVE CHOLANGIOGRAM (N/A) Advance dietas tol OK for home today Will need f/u with GI when  insurance set  Can f/u with [place] in clinic post op  LOS: 2 days    Ralene Ok 07/31/2022

## 2022-08-01 ENCOUNTER — Telehealth: Payer: Self-pay

## 2022-08-01 NOTE — Telephone Encounter (Signed)
Transition Care Management Follow-up Telephone Call Date of discharge and from where: [facility] 07/31/2022 How have you been since you were released from the hospital? Pressure pain unable to have bowel movement Any questions or concerns? Yes  Items Reviewed: Did the pt receive and understand the discharge instructions provided? Yes  Medications obtained and verified? Yes  Other? No  Any new allergies since your discharge? No  Dietary orders reviewed? Yes Do you have support at home? Yes   Home Care and Equipment/Supplies: Were home health services ordered? no If so, what is the name of the agency? N/a Has the agency set up a time to come to the patient's home? not applicable Were any new equipment or medical supplies ordered?  No What is the name of the medical supply agency? N/a Were you able to get the supplies/equipment? not applicable Do you have any questions related to the use of the equipment or supplies? No  Functional Questionnaire: (I = Independent and D = Dependent) ADLs: I  Bathing/Dressing- I  Meal Prep- I  Eating- I  Maintaining continence- I  Transferring/Ambulation- I  Managing Meds- I  Follow up appointments reviewed:  PCP Hospital f/u appt confirmed? Yes  Scheduled to see Dr Jerline Pain on 08/04/2022 @ 11:00. [facility] f/u appt confirmed? NO patient will scheduleScheduled to see [facility]  Are transportation arrangements needed? No  If their condition worsens, is the pt aware to call PCP or go to the Emergency Dept.? Yes Was the patient provided with contact information for the PCP's office or ED? Yes Was to pt encouraged to call back with questions or concerns? Yes Juanda Crumble, LPN [facility] [phone]

## 2022-08-04 ENCOUNTER — Other Ambulatory Visit: Payer: Self-pay | Admitting: Family Medicine

## 2022-08-04 ENCOUNTER — Ambulatory Visit (INDEPENDENT_AMBULATORY_CARE_PROVIDER_SITE_OTHER): Payer: BC Managed Care – PPO | Admitting: Family Medicine

## 2022-08-04 ENCOUNTER — Encounter: Payer: Self-pay | Admitting: Family Medicine

## 2022-08-04 VITALS — BP 122/74 | HR 68 | Temp 97.5°F | Ht 75.0 in | Wt 206.6 lb

## 2022-08-04 DIAGNOSIS — N529 Male erectile dysfunction, unspecified: Secondary | ICD-10-CM | POA: Diagnosis not present

## 2022-08-04 DIAGNOSIS — D598 Other acquired hemolytic anemias: Secondary | ICD-10-CM | POA: Diagnosis not present

## 2022-08-04 DIAGNOSIS — F411 Generalized anxiety disorder: Secondary | ICD-10-CM | POA: Diagnosis not present

## 2022-08-04 DIAGNOSIS — K819 Cholecystitis, unspecified: Secondary | ICD-10-CM

## 2022-08-04 LAB — CBC AND DIFFERENTIAL
HCT: 44 (ref 41–53)
Hemoglobin: 14.2 (ref 13.5–17.5)
Neutrophils Absolute: 5046
Platelets: 184 10*3/uL (ref 150–400)
WBC: 8.1

## 2022-08-04 LAB — BASIC METABOLIC PANEL
BUN: 19 (ref 4–21)
CO2: 19 (ref 13–22)
Chloride: 109 — AB (ref 99–108)
Creatinine: 1.1 (ref 0.6–1.3)
Glucose: 91
Potassium: 4.6 mEq/L (ref 3.5–5.1)
Sodium: 139 (ref 137–147)

## 2022-08-04 LAB — HEPATIC FUNCTION PANEL
ALT: 16 U/L (ref 10–40)
AST: 14 (ref 14–40)
Alkaline Phosphatase: 73 (ref 25–125)
Bilirubin, Direct: 0.3
Bilirubin, Total: 1.3

## 2022-08-04 LAB — COMPREHENSIVE METABOLIC PANEL
Albumin: 4.6 (ref 3.5–5.0)
Calcium: 9.5 (ref 8.7–10.7)
Globulin: 2.1

## 2022-08-04 LAB — CBC: RBC: 4.34 (ref 3.87–5.11)

## 2022-08-04 MED ORDER — OXYCODONE HCL 5 MG PO TABS: 5.0000 mg | ORAL_TABLET | Freq: Four times a day (QID) | ORAL | 0 refills | Status: DC | PRN

## 2022-08-04 MED ORDER — TADALAFIL 20 MG PO TABS: 20.0000 mg | ORAL_TABLET | Freq: Every day | ORAL | 11 refills | Status: DC | PRN

## 2022-08-04 NOTE — Progress Notes (Signed)
Chief Complaint:  Scott Watts is a 41 y.o. male who presents today for a TCM visit.  Assessment/Plan:  New/Acute Problems: Cholecystitis  He is doing well postoperatively.  He does have some residual abdominal pain but this seems to be improving the last couple of days.  He is doing well with bowel regimen and is starting to have more bowel movements.  He will follow-up with surgery in a week and a half.  We encouraged relative rest with frequent ambulation, good oral hydration and continue with his bowel regimen.  We will also send in a few more of his pain medication to use as needed.  Elevated bilirubin level Found while in the hospital.  Could be due to his gallbladder pathology however he did have a hematologic work-up done a year ago due to elevated bilirubin secondary to hemolytic anemia.We will be addressing his hemolytic anemia as below  Chronic Problems Addressed Today: Hemolytic anemia (HCC) Patient follows with hematology for this.  Last seen about a year ago for this.  Was found to have elevated bilirubin again while in the hospital which is likely due to his hemolytic anemia.  We will recheck his hemolytic labs today as well as a fractionated bilirubin level though assuming labs are stable we will defer further management to hematology.  Erectile dysfunction Tadalafil refilled.  Anxiety state Symptoms are stable on current regimen BuSpar 10 mg twice daily and clonazepam 0.5 to 1 mg twice daily as needed.  Does not need refill today.    Subjective:  HPI:  Summary of Hospital admission: Reason for admission: Cholecystitis Date of admission: 07/29/2022 Date of discharge: 07/31/2022 Date of Interactive contact: 08/01/2022 Summary of Hospital course: Patient presented to the ED on 07/29/2022 after previously being seen in outside hospital for right upper quadrant pain and being found to have acute cholecystitis.  Pain persisted he went back to the ED on 07/25/2022.   He was admitted for laparoscopic cholecystectomy.  Did well postoperatively.  Diet was advanced and he was discharged home on hospital day 2 to follow-up with general surgery.  He was instantly found to have elevated bilirubin.  He was referred to GI as an outpatient for further evaluation.  Interim history:  Since being home he has done reasonably well.  Still has quite a bit of pain in abdominal area but this seems to be improving the last couple days.  Had some issues with constipation but this seems to be improving with bowel regimen at home.  No fevers or chills.   ROS: Per HPI, otherwise a complete review of systems was negative.   PMH:  The following were reviewed and entered/updated in epic: Past Medical History:  Diagnosis Date   Elevated bilirubin    Low serum vitamin B12    Patient Active Problem List   Diagnosis Date Noted   Acute cholecystitis 07/29/2022   Macrocytosis 03/20/2021   Hemolytic anemia (HCC) 03/20/2021   B12 deficiency 02/18/2021   Low HDL (under 40) 11/12/2019   Androgenic alopecia 03/01/2019   Condyloma 03/01/2019   Insomnia, unspecified 01/24/2019   Chronic right-sided low back pain with right-sided sciatica 10/26/2018   Erectile dysfunction 08/27/2018   Skin lesion 08/27/2018   Nicotine dependence with current use 08/27/2018   Anxiety state 01/04/2010   Past Surgical History:  Procedure Laterality Date   CHOLECYSTECTOMY N/A 07/30/2022   Procedure: LAPAROSCOPIC CHOLECYSTECTOMY WITH INTRAOPERATIVE CHOLANGIOGRAM;  Surgeon: Axel Filler, MD;  Location: [facility];  Service: General;  Laterality: N/A;   COLONOSCOPY  2014   CONDYLOMA EXCISION/FULGURATION     orthodontics surgery      Family History  Problem Relation Age of Onset   Cerebral aneurysm Mother    Alcohol abuse Father    Cancer Neg Hx     Medications- Reconciled discharge and current medications in Epic.  Current Outpatient Medications  Medication Sig Dispense Refill   busPIRone  (BUSPAR) 10 MG tablet TAKE ONE TABLET BY MOUTH TWICE A DAY (Patient taking differently: Take 10 mg by mouth 2 (two) times daily.) 60 tablet 5   clonazePAM (KLONOPIN) 1 MG tablet TAKE 0.5 TO 1 TABLET BY MOUTH TWO TIMES A DAY AS NEEDED FOR ANXIETY (Patient taking differently: Take 1 mg by mouth 2 (two) times daily as needed for anxiety.) 30 tablet 5   Emollient (COLLAGEN EX) Apply 1 Application topically daily.     hydrOXYzine (ATARAX) 25 MG tablet TAKE 1 TO 2 TABLETS BY MOUTH EVERY NIGHT AT BEDTIME AS NEEDED FOR INSOMNIA (Patient taking differently: Take 25 mg by mouth at bedtime as needed (insomnia).) 90 tablet 3   ibuprofen (ADVIL) 200 MG tablet Take 200 mg by mouth every 6 (six) hours as needed for headache or mild pain.     oxyCODONE (ROXICODONE) 5 MG immediate release tablet Take 1 tablet (5 mg total) by mouth every 6 (six) hours as needed for moderate pain. 20 tablet 0   tadalafil (CIALIS) 20 MG tablet Take 1 tablet (20 mg total) by mouth daily as needed for erectile dysfunction. 30 tablet 11   No current facility-administered medications for this visit.    Allergies-reviewed and updated No Known Allergies  Social History   Socioeconomic History   Marital status: Single    Spouse name: Not on file   Number of children: Not on file   Years of education: Not on file   Highest education level: Not on file  Occupational History   Not on file  Tobacco Use   Smoking status: Every Day    Packs/day: 0.50    Types: Cigarettes    Last attempt to quit: 02/28/2020    Years since quitting: 2.4   Smokeless tobacco: Never   Tobacco comments:    0.5 ppd  Vaping Use   Vaping Use: Never used  Substance and Sexual Activity   Alcohol use: Yes    Alcohol/week: 2.0 standard drinks of alcohol    Types: 2 Cans of beer per week   Drug use: Never   Sexual activity: Yes  Other Topics Concern   Not on file  Social History Narrative   Not on file   Social Determinants of Health   Financial  Resource Strain: Not on file  Food Insecurity: No Food Insecurity (07/30/2022)   Hunger Vital Sign    Worried About Running Out of Food in the Last Year: Never true    Ran Out of Food in the Last Year: Never true  Transportation Needs: No Transportation Needs (07/30/2022)   PRAPARE - Hydrologist (Medical): No    Lack of Transportation (Non-Medical): No  Physical Activity: Not on file  Stress: Not on file  Social Connections: Not on file        Objective:  Physical Exam: BP 122/74 (BP Location: Right Arm, Patient Position: Sitting)   Pulse 68   Temp (!) 97.5 F (36.4 C) (Temporal)   Ht 6\' 3"  (1.905 m)   Wt 206 lb 9.6 oz (93.7  kg)   SpO2 94%   BMI 25.82 kg/m   Gen: NAD, resting comfortably CV: RRR with no murmurs appreciated Pulm: NWOB, CTAB with no crackles, wheezes, or rhonchi GI: Normal bowel sounds present. Soft, Nontender, Nondistended.  Surgical incisions clean and dry. MSK: No edema, cyanosis, or clubbing noted Skin: Warm, dry Neuro: Grossly normal, moves all extremities Psych: Normal affect and thought content   Time Spent: 40 minutes of total time was spent on the date of the encounter performing the following actions: chart review prior to seeing the patient including recent hospitalization, obtaining history, performing a medically necessary exam, counseling on the treatment plan, placing orders, and documenting in our EHR.        Algis Greenhouse. Jerline Pain, MD 08/04/2022 11:56 AM

## 2022-08-04 NOTE — Assessment & Plan Note (Signed)
Symptoms are stable on current regimen BuSpar 10 mg twice daily and clonazepam 0.5 to 1 mg twice daily as needed.  Does not need refill today.

## 2022-08-04 NOTE — Assessment & Plan Note (Signed)
Tadalafil refilled.

## 2022-08-04 NOTE — Patient Instructions (Signed)
It was very nice to see you today!  I will refill your pain medications today.  Please make sure that she will continue to take your laxatives and stool softeners.  Make sure that you are getting plenty of fluids and walking around when you are able to.  We will check blood work today for your elevated bilirubin level.  If this is stable compared to what we did last year we would not need to do any further testing.  Please come back to see me in about 3-6 months for an annual physical.  Please come back to see me sooner if needed.  Take care, Dr Jerline Pain  PLEASE NOTE:  If you had any lab tests please let us know if you have not heard back within a few days. You may see your results on mychart before we have a chance to review them but we will give you a call once they are reviewed by [place]. If we ordered any referrals today, please let us know if you have not heard from their office within the next week.   Please try these tips to maintain a healthy lifestyle:  Eat at least 3 REAL meals and 1-2 snacks per day.  Aim for no more than 5 hours between eating.  If you eat breakfast, please do so within one hour of getting up.   Each meal should contain half fruits/vegetables, one quarter protein, and one quarter carbs (no bigger than a computer mouse)  Cut down on sweet beverages. This includes juice, soda, and sweet tea.   Drink at least 1 glass of water with each meal and aim for at least 8 glasses per day  Exercise at least 150 minutes every week.

## 2022-08-04 NOTE — Assessment & Plan Note (Signed)
Patient follows with hematology for this.  Last seen about a year ago for this.  Was found to have elevated bilirubin again while in the hospital which is likely due to his hemolytic anemia.  We will recheck his hemolytic labs today as well as a fractionated bilirubin level though assuming labs are stable we will defer further management to hematology.

## 2022-08-08 ENCOUNTER — Encounter: Payer: Self-pay | Admitting: Family Medicine

## 2022-08-13 ENCOUNTER — Encounter: Payer: Self-pay | Admitting: Family Medicine

## 2022-08-13 ENCOUNTER — Other Ambulatory Visit: Payer: Self-pay | Admitting: *Deleted

## 2022-08-13 DIAGNOSIS — L989 Disorder of the skin and subcutaneous tissue, unspecified: Secondary | ICD-10-CM

## 2022-08-13 NOTE — Telephone Encounter (Signed)
Called pt and explained to him that we reached out to lab for his results and unfortunately, they can't locate his blood sample. I apologized and offered to schedule him for lab appointment. He stated that he will see his surgeon tomorrow and if for some reason they can't do lab work at their office, he will call us back to schedule lab visit.

## 2022-08-13 NOTE — Telephone Encounter (Signed)
Future order placed 

## 2022-08-13 NOTE — Telephone Encounter (Signed)
Send information to Shelbie Proctor to check on blood work results

## 2022-08-23 ENCOUNTER — Other Ambulatory Visit: Payer: Self-pay | Admitting: Family Medicine

## 2022-09-18 ENCOUNTER — Encounter: Payer: Self-pay | Admitting: Family Medicine

## 2022-09-18 ENCOUNTER — Ambulatory Visit: Payer: BC Managed Care – PPO | Admitting: Family Medicine

## 2022-09-18 VITALS — BP 118/73 | HR 65 | Temp 97.7°F | Ht 75.0 in | Wt 211.0 lb

## 2022-09-18 DIAGNOSIS — F411 Generalized anxiety disorder: Secondary | ICD-10-CM | POA: Diagnosis not present

## 2022-09-18 DIAGNOSIS — R059 Cough, unspecified: Secondary | ICD-10-CM | POA: Diagnosis not present

## 2022-09-18 DIAGNOSIS — H6690 Otitis media, unspecified, unspecified ear: Secondary | ICD-10-CM | POA: Diagnosis not present

## 2022-09-18 DIAGNOSIS — N529 Male erectile dysfunction, unspecified: Secondary | ICD-10-CM

## 2022-09-18 LAB — POCT INFLUENZA A/B
Influenza A, POC: NEGATIVE
Influenza B, POC: NEGATIVE

## 2022-09-18 LAB — POCT RAPID STREP A (OFFICE): Rapid Strep A Screen: POSITIVE — AB

## 2022-09-18 LAB — POC COVID19 BINAXNOW: SARS Coronavirus 2 Ag: NEGATIVE

## 2022-09-18 NOTE — Assessment & Plan Note (Signed)
Symptoms well-controlled on current regimen is 40 mg twice daily and clonazepam 0.5 mg to 1 mg twice daily as needed.  Does not need refills today.  Database reviewed without red flags.  No significant side effects from medications.

## 2022-09-18 NOTE — Assessment & Plan Note (Signed)
Stable on tadalafil as needed.  Does not need refill today. 

## 2022-09-18 NOTE — Patient Instructions (Signed)
It was very nice to see you today!  You have an ear infection.  Your strep test is positive.  Please start the Augmentin.  Let me know if not improving.  Take care, Dr [name]  PLEASE NOTE:  If you had any lab tests please let us know if you have not heard back within a few days. You may see your results on mychart before we have a chance to review them but we will give you a call once they are reviewed by [place]. If we ordered any referrals today, please let us know if you have not heard from their office within the next week.   Please try these tips to maintain a healthy lifestyle:  Eat at least 3 REAL meals and 1-2 snacks per day.  Aim for no more than 5 hours between eating.  If you eat breakfast, please do so within one hour of getting up.   Each meal should contain half fruits/vegetables, one quarter protein, and one quarter carbs (no bigger than a computer mouse)  Cut down on sweet beverages. This includes juice, soda, and sweet tea.   Drink at least 1 glass of water with each meal and aim for at least 8 glasses per day  Exercise at least 150 minutes every week.

## 2022-09-18 NOTE — Progress Notes (Signed)
   Scott Watts is a 40 y.o. male who presents today for an office visit.  Assessment/Plan:  New/Acute Problems: Otitis media / Strep Pharyngitis  Rapid strep mildly positive though does have some evidence of otitis media on exam as well.  Will start Augmentin.  Encouraged hydration.  Can use over-the-counter meds as needed.  He will let me know if not improving.  We discussed reasons to return to care.  Follow-up as needed.  Chronic Problems Addressed Today: Anxiety state Symptoms well-controlled on current regimen is 40 mg twice daily and clonazepam 0.5 mg to 1 mg twice daily as needed.  Does not need refills today.  Database reviewed without red flags.  No significant side effects from medications.  Erectile dysfunction Stable on tadalafil as needed.  Does not need refill today.     Subjective:  HPI:  See A/p for status of chronic conditions.  Symptoms started 3-4 days ago with sore throat, congestion, tender lymphnodes, sinus pressure, left ear pressure, and headache. Tied taking sudafed and increasing water intake. Not sure if symptoms improved. No known sick contacts. No fevers or chills. No chest pain or shortness of breath. No body aches.         Objective:  Physical Exam: BP 118/73   Pulse 65   Temp 97.7 F (36.5 C) (Temporal)   Ht 6\' 3"  (1.905 m)   Wt 211 lb (95.7 kg)   SpO2 94%   BMI 26.37 kg/m   Gen: No acute distress, resting comfortably HEENT: Left bulging and erythematous.  Right TM erythematous OP erythematous.  CV: Regular rate and rhythm with no murmurs appreciated Pulm: Normal work of breathing, clear to auscultation bilaterally with no crackles, wheezes, or rhonchi Neuro: Grossly normal, moves all extremities Psych: Normal affect and thought content      Scott Watts M. , MD 09/18/2022 3:05 PM

## 2022-09-19 ENCOUNTER — Telehealth: Payer: Self-pay | Admitting: Family Medicine

## 2022-09-19 MED ORDER — AMOXICILLIN-POT CLAVULANATE 875-125 MG PO TABS: 1.0000 | ORAL_TABLET | Freq: Two times a day (BID) | ORAL | 0 refills | Status: DC

## 2022-09-19 NOTE — Telephone Encounter (Signed)
Spoke with patient, patient notified Rx amoxicillin-clavulanate (AUGMENTIN) 875-125 MG tablet Was send in yesterday  I explain Patient amoxicillin-clavulante is same as Augmentin  Patient verbalized understanding

## 2022-09-19 NOTE — Telephone Encounter (Signed)
Rx was sent in again just now.  Katina Degree. [name], MD 09/19/2022 8:49 AM

## 2022-09-19 NOTE — Telephone Encounter (Signed)
Pt is needing rx prescribed yesterday called in asap.  Patient Name: Scott Watts Gender: Male DOB: 05-11-1981 Age: 41 Y 4 M 1 D Return Phone Number: [phone] (Primary) Address: City/ State/ Zip: [place]  [address] Client [facility] - Human resources officer [facility] [employer] Provider Jacquiline Doe- MD Contact Type Call Who Is Calling Patient / Member / Family / Caregiver Call Type Triage / Clinical Caller Name Burlie Cajamarca Relationship To Patient Self Return Phone Number [phone] (Primary) Chief Complaint Paging or Request for Consult Reason for Call Medication Question / Request Initial Comment Caller saw Dr. Jacquiline Doe, MD. The doctor was supposed to send in a script for Augmentin, however the pharmacy has not received a script for the patient. [facility] [phone] ( pick pharmacy)/ [facility] Translation No No Triage Reason Patient declined Nurse Assessment Nurse: Tillman Abide, RN, Stanton Kidney Date/Time Lamount Cohen Time): 09/18/2022 7:39:17 PM Confirm and document reason for call. If symptomatic, describe symptoms. ---Caller states was seen in office today and rx for augmentin was supposed to be sent in but pharmacy did not receive it. being treated for ear infection and strep. [facility] [phone]. Does the patient have any new or worsening symptoms? ---Yes Will a triage be completed? ---No Select reason for no triage. ---Patient declined Please document clinical information provided and list any resource used. ---advised per client directives no rx after hours. advised will send message to office but to contact office when open to verify they rec'd message. verbalizes understanding. Nurse: Tillman Abide, RN, Debra Date/Time (Eastern Time): 09/18/2022 7:45:25 PM Please select the assessment type ---RX called in but not at pharm Document the name of the medication.  ---augmentin Pharmacy name and phone number. ---[facility] [phone] Has the office closed within the last 30 minutes? ---No Nurse Assessment Does the client directives allow for assistance with medications after hours? ---No Disp. Time Lamount Cohen Time) Disposition Final User 09/18/2022 7:06:14 PM Send To Call Back Waiting For Nurse Talmadge Coventry 09/18/2022 7:21:44 PM Send To Clinical Follow Up Lonzo Cloud, RNMisty Stanley 09/18/2022 7:46:07 PM Clinical Call Yes Tillman Abide, RN, Debra Final Disposition 09/18/2022 7:46:07 PM Clinical Call Yes Tillman Abide, RN, Stanton Kidney

## 2022-09-19 NOTE — Telephone Encounter (Signed)
Pt states: -Augmentin, was supposed to be sent in, not amoxicillin. Per AVS: " You have an ear infection.   Your strep test is positive.  Please start the Augmentin.  Let me know if not improving."   Pt requests: -medicine to be sent in.  [id number] [place], [place] - [address], [place] [address] Phone: [phone]  Fax: [phone]

## 2022-09-19 NOTE — Addendum Note (Signed)
Addended by: Ardith Dark on: 09/19/2022 08:49 AM   Modules accepted: Orders

## 2022-10-21 ENCOUNTER — Encounter: Payer: Self-pay | Admitting: Family Medicine

## 2022-10-27 ENCOUNTER — Other Ambulatory Visit: Payer: Self-pay | Admitting: Family Medicine

## 2022-11-21 ENCOUNTER — Encounter: Payer: Self-pay | Admitting: Family Medicine

## 2022-11-21 ENCOUNTER — Ambulatory Visit (INDEPENDENT_AMBULATORY_CARE_PROVIDER_SITE_OTHER): Payer: BC Managed Care – PPO | Admitting: Family Medicine

## 2022-11-21 VITALS — BP 110/70 | HR 80 | Temp 97.7°F | Ht 75.0 in | Wt 208.0 lb

## 2022-11-21 DIAGNOSIS — D598 Other acquired hemolytic anemias: Secondary | ICD-10-CM

## 2022-11-21 DIAGNOSIS — N529 Male erectile dysfunction, unspecified: Secondary | ICD-10-CM

## 2022-11-21 DIAGNOSIS — E786 Lipoprotein deficiency: Secondary | ICD-10-CM

## 2022-11-21 DIAGNOSIS — E538 Deficiency of other specified B group vitamins: Secondary | ICD-10-CM | POA: Diagnosis not present

## 2022-11-21 DIAGNOSIS — F411 Generalized anxiety disorder: Secondary | ICD-10-CM

## 2022-11-21 DIAGNOSIS — Z0001 Encounter for general adult medical examination with abnormal findings: Secondary | ICD-10-CM | POA: Diagnosis not present

## 2022-11-21 DIAGNOSIS — R739 Hyperglycemia, unspecified: Secondary | ICD-10-CM | POA: Diagnosis not present

## 2022-11-21 NOTE — Assessment & Plan Note (Signed)
Check lipids 

## 2022-11-21 NOTE — Assessment & Plan Note (Signed)
Stable on cialis 20 mg daily as needed.

## 2022-11-21 NOTE — Addendum Note (Signed)
Addended by: Doran Clay A on: 11/21/2022 03:14 PM   Modules accepted: Orders

## 2022-11-21 NOTE — Assessment & Plan Note (Signed)
Check B12 

## 2022-11-21 NOTE — Assessment & Plan Note (Signed)
Will check labs including haptoglobin, fractionated bilirubin, LDH, and CBC.  As previously follow-up with hematology for this.

## 2022-11-21 NOTE — Patient Instructions (Addendum)
It was very nice to see you today!  We will check blood work today.  Please continue work on diet and exercise.  Will see you back in year for your next physical.  Come back sooner if needed.  Take care, Dr Jerline Pain  PLEASE NOTE:  If you had any lab tests, please let us know if you have not heard back within a few days. You may see your results on mychart before we have a chance to review them but we will give you a call once they are reviewed by [place].   If we ordered any referrals today, please let us know if you have not heard from their office within the next week.   If you had any urgent prescriptions sent in today, please check with the pharmacy within an hour of our visit to make sure the prescription was transmitted appropriately.   Please try these tips to maintain a healthy lifestyle:  Eat at least 3 REAL meals and 1-2 snacks per day.  Aim for no more than 5 hours between eating.  If you eat breakfast, please do so within one hour of getting up.   Each meal should contain half fruits/vegetables, one quarter protein, and one quarter carbs (no bigger than a computer mouse)  Cut down on sweet beverages. This includes juice, soda, and sweet tea.   Drink at least 1 glass of water with each meal and aim for at least 8 glasses per day  Exercise at least 150 minutes every week.    Preventive Care 34-82 Years Old, Male Preventive care refers to lifestyle choices and visits with your health care provider that can promote health and wellness. Preventive care visits are also called wellness exams. What can I expect for my preventive care visit? Counseling During your preventive care visit, your health care provider may ask about your: Medical history, including: Past medical problems. Family medical history. Current health, including: Emotional well-being. Home life and relationship well-being. Sexual activity. Lifestyle, including: Alcohol, nicotine or tobacco, and drug  use. Access to firearms. Diet, exercise, and sleep habits. Safety issues such as seatbelt and bike helmet use. Sunscreen use. Work and work Statistician. Physical exam Your health care provider will check your: Height and weight. These may be used to calculate your BMI (body mass index). BMI is a measurement that tells if you are at a healthy weight. Waist circumference. This measures the distance around your waistline. This measurement also tells if you are at a healthy weight and may help predict your risk of certain diseases, such as type 2 diabetes and high blood pressure. Heart rate and blood pressure. Body temperature. Skin for abnormal spots. What immunizations do I need?  Vaccines are usually given at various ages, according to a schedule. Your health care provider will recommend vaccines for you based on your age, medical history, and lifestyle or other factors, such as travel or where you work. What tests do I need? Screening Your health care provider may recommend screening tests for certain conditions. This may include: Lipid and cholesterol levels. Diabetes screening. This is done by checking your blood sugar (glucose) after you have not eaten for a while (fasting). Hepatitis B test. Hepatitis C test. HIV (human immunodeficiency virus) test. STI (sexually transmitted infection) testing, if you are at risk. Lung cancer screening. Prostate cancer screening. Colorectal cancer screening. Talk with your health care provider about your test results, treatment options, and if necessary, the need for more tests. Follow these instructions  at home: Eating and drinking  Eat a diet that includes fresh fruits and vegetables, whole grains, lean protein, and low-fat dairy products. Take vitamin and mineral supplements as recommended by your health care provider. Do not drink alcohol if your health care provider tells you not to drink. If you drink alcohol: Limit how much you have to  0-2 drinks a day. Know how much alcohol is in your drink. In the [place] one drink equals one 12 oz bottle of beer (355 mL), one 5 oz glass of wine (148 mL), or one 1 oz glass of hard liquor (44 mL). Lifestyle Brush your teeth every morning and night with fluoride toothpaste. Floss one time each day. Exercise for at least 30 minutes 5 or more days each week. Do not use any products that contain nicotine or tobacco. These products include cigarettes, chewing tobacco, and vaping devices, such as e-cigarettes. If you need help quitting, ask your health care provider. Do not use drugs. If you are sexually active, practice safe sex. Use a condom or other form of protection to prevent STIs. Take aspirin only as told by your health care provider. Make sure that you understand how much to take and what form to take. Work with your health care provider to find out whether it is safe and beneficial for you to take aspirin daily. Find healthy ways to manage stress, such as: Meditation, yoga, or listening to music. Journaling. Talking to a trusted person. Spending time with friends and family. Minimize exposure to UV radiation to reduce your risk of skin cancer. Safety Always wear your seat belt while driving or riding in a vehicle. Do not drive: If you have been drinking alcohol. Do not ride with someone who has been drinking. When you are tired or distracted. While texting. If you have been using any mind-altering substances or drugs. Wear a helmet and other protective equipment during sports activities. If you have firearms in your house, make sure you follow all gun safety procedures. What's next? Go to your health care provider once a year for an annual wellness visit. Ask your health care provider how often you should have your eyes and teeth checked. Stay up to date on all vaccines. This information is not intended to replace advice given to you by your health care provider. Make sure you  discuss any questions you have with your health care provider. Document Revised: 03/20/2021 Document Reviewed: 03/20/2021 Elsevier Patient Education  [place].

## 2022-11-21 NOTE — Progress Notes (Signed)
Chief Complaint:  Scott Watts is a 42 y.o. male who presents today for his annual comprehensive physical exam.    Assessment/Plan:  New/Acute Problems: Concern for decreased hearing In office hearing test normal today.  Will continue fluctuating.  He will let me know if this worsens.  Chronic Problems Addressed Today: Anxiety state Stable on buspirone 10 mg twice daily and clonazepam 1 mg twice daily as needed.  No refills needed today.  Erectile dysfunction Stable on cialis 20 mg daily as needed.  Low HDL (under 40) Check lipids.  B12 deficiency Check B12.  Hemolytic anemia (HCC) Will check labs including haptoglobin, fractionated bilirubin, LDH, and CBC.  As previously follow-up with hematology for this.  Preventative Healthcare: Check labs.  Flu vaccine deferred.  Patient Counseling(The following topics were reviewed and/or handout was given):  -Nutrition: Stressed importance of moderation in sodium/caffeine intake, saturated fat and cholesterol, caloric balance, sufficient intake of fresh fruits, vegetables, and fiber.  -Stressed the importance of regular exercise.   -Substance Abuse: Discussed cessation/primary prevention of tobacco, alcohol, or other drug use; driving or other dangerous activities under the influence; availability of treatment for abuse.   -Injury prevention: Discussed safety belts, safety helmets, smoke detector, smoking near bedding or upholstery.   -Sexuality: Discussed sexually transmitted diseases, partner selection, use of condoms, avoidance of unintended pregnancy and contraceptive alternatives.   -Dental health: Discussed importance of regular tooth brushing, flossing, and dental visits.  -Health maintenance and immunizations reviewed. Please refer to Health maintenance section.  Return to care in 1 year for next preventative visit.     Subjective:  HPI:  He has no acute complaints today. See A/p for status of chronic conditions.   We  last saw him about a month ago.  At that time he was dealing with otitis media and strep pharyngitis.  Started on Augmentin.  Symptoms improved.  He has noticed that people sometimes stated he asks "what?"  A lot but he has not noticed any decreased hearing.  Lifestyle Diet: Trying to get more protein and fiber.  Exercise: Goes to gym routinely. Working on cardio.      11/21/2022    2:12 PM  Depression screen PHQ 2/9  Decreased Interest 1  Down, Depressed, Hopeless 1  PHQ - 2 Score 2  Altered sleeping 2  Tired, decreased energy 1  Change in appetite 0  Feeling bad or failure about yourself  1  Trouble concentrating 1  Moving slowly or fidgety/restless 0  Suicidal thoughts 0  PHQ-9 Score 7  Difficult doing work/chores Somewhat difficult    There are no preventive care reminders to display for this patient.   ROS: Per HPI, otherwise a complete review of systems was negative.   PMH:  The following were reviewed and entered/updated in epic: Past Medical History:  Diagnosis Date   Elevated bilirubin    Low serum vitamin B12    Patient Active Problem List   Diagnosis Date Noted   Macrocytosis 03/20/2021   Hemolytic anemia ([place]) 03/20/2021   B12 deficiency 02/18/2021   Low HDL (under 40) 11/12/2019   Androgenic alopecia 03/01/2019   Condyloma 03/01/2019   Insomnia, unspecified 01/24/2019   Chronic right-sided low back pain with right-sided sciatica 10/26/2018   Erectile dysfunction 08/27/2018   Skin lesion 08/27/2018   Nicotine dependence with current use 08/27/2018   Anxiety state 01/04/2010   Past Surgical History:  Procedure Laterality Date   CHOLECYSTECTOMY N/A 07/30/2022   Procedure: LAPAROSCOPIC CHOLECYSTECTOMY  WITH INTRAOPERATIVE CHOLANGIOGRAM;  Surgeon: Ralene Ok, MD;  Location: [facility];  Service: General;  Laterality: N/A;   COLONOSCOPY  2014   CONDYLOMA EXCISION/FULGURATION     orthodontics surgery      Family History  Problem Relation Age of Onset    Cerebral aneurysm Mother    Alcohol abuse Father    Cancer Neg Hx     Medications- reviewed and updated Current Outpatient Medications  Medication Sig Dispense Refill   busPIRone (BUSPAR) 10 MG tablet TAKE 1 TABLET BY MOUTH TWICE A DAY 60 tablet 5   clonazePAM (KLONOPIN) 1 MG tablet Take 1 tablet (1 mg total) by mouth 2 (two) times daily as needed for anxiety. 60 tablet 5   Emollient (COLLAGEN EX) Apply 1 Application topically daily.     hydrOXYzine (ATARAX) 25 MG tablet Take 1 tablet (25 mg total) by mouth at bedtime as needed (insomnia). 90 tablet 1   ibuprofen (ADVIL) 200 MG tablet Take 200 mg by mouth every 6 (six) hours as needed for headache or mild pain.     tadalafil (CIALIS) 20 MG tablet Take 1 tablet (20 mg total) by mouth daily as needed for erectile dysfunction. 30 tablet 11   No current facility-administered medications for this visit.    Allergies-reviewed and updated No Known Allergies  Social History   Socioeconomic History   Marital status: Single    Spouse name: Not on file   Number of children: Not on file   Years of education: Not on file   Highest education level: Not on file  Occupational History   Not on file  Tobacco Use   Smoking status: Every Day    Packs/day: 0.50    Types: Cigarettes    Last attempt to quit: 02/28/2020    Years since quitting: 2.7   Smokeless tobacco: Never   Tobacco comments:    0.5 ppd  Vaping Use   Vaping Use: Never used  Substance and Sexual Activity   Alcohol use: Yes    Alcohol/week: 2.0 standard drinks of alcohol    Types: 2 Cans of beer per week   Drug use: Never   Sexual activity: Yes  Other Topics Concern   Not on file  Social History Narrative   Not on file   Social Determinants of Health   Financial Resource Strain: Not on file  Food Insecurity: No Food Insecurity (07/30/2022)   Hunger Vital Sign    Worried About Running Out of Food in the Last Year: Never true    Ran Out of Food in the Last Year:  Never true  Transportation Needs: No Transportation Needs (07/30/2022)   PRAPARE - Hydrologist (Medical): No    Lack of Transportation (Non-Medical): No  Physical Activity: Not on file  Stress: Not on file  Social Connections: Not on file        Objective:  Physical Exam: BP 110/70   Pulse 80   Temp 97.7 F (36.5 C) (Temporal)   Ht 6' 3"$  (1.905 m)   Wt 208 lb (94.3 kg)   SpO2 93%   BMI 26.00 kg/m   Body mass index is 26 kg/m. Wt Readings from Last 3 Encounters:  11/21/22 208 lb (94.3 kg)  09/18/22 211 lb (95.7 kg)  08/04/22 206 lb 9.6 oz (93.7 kg)   Gen: NAD, resting comfortably HEENT: TMs normal bilaterally. OP clear. No thyromegaly noted.  CV: RRR with no murmurs appreciated Pulm: NWOB, CTAB with  no crackles, wheezes, or rhonchi GI: Normal bowel sounds present. Soft, Nontender, Nondistended. MSK: no edema, cyanosis, or clubbing noted Skin: warm, dry Neuro: CN2-12 grossly intact. Strength 5/5 in upper and lower extremities. Reflexes symmetric and intact bilaterally.  Psych: Normal affect and thought content     [address] M. Jerline Pain, MD 11/21/2022 2:54 PM

## 2022-11-21 NOTE — Assessment & Plan Note (Signed)
Stable on buspirone 10 mg twice daily and clonazepam 1 mg twice daily as needed.  No refills needed today.

## 2022-11-22 LAB — BILIRUBIN, FRACTIONATED(TOT/DIR/INDIR)
Bilirubin, Direct: 0.5 mg/dL — ABNORMAL HIGH (ref 0.0–0.2)
Indirect Bilirubin: 2.4 mg/dL (calc) — ABNORMAL HIGH (ref 0.2–1.2)
Total Bilirubin: 2.9 mg/dL — ABNORMAL HIGH (ref 0.2–1.2)

## 2022-11-24 LAB — CBC
HCT: 38.9 % (ref 38.5–50.0)
Hemoglobin: 13.4 g/dL (ref 13.2–17.1)
MCH: 34.4 pg — ABNORMAL HIGH (ref 27.0–33.0)
MCHC: 34.4 g/dL (ref 32.0–36.0)
MCV: 100 fL (ref 80.0–100.0)
MPV: 10.3 fL (ref 7.5–12.5)
Platelets: 161 10*3/uL (ref 140–400)
RBC: 3.89 10*6/uL — ABNORMAL LOW (ref 4.20–5.80)
RDW: 11.4 % (ref 11.0–15.0)
WBC: 7.6 10*3/uL (ref 3.8–10.8)

## 2022-11-24 LAB — LIPID PANEL
Cholesterol: 137 mg/dL (ref <200)
HDL: 27 mg/dL — ABNORMAL LOW (ref >=40)
LDL Cholesterol (Calc): 72 mg/dL (calc)
Non-HDL Cholesterol (Calc): 110 mg/dL (calc) (ref <130)
Total CHOL/HDL Ratio: 5.1 (calc) — ABNORMAL HIGH (ref <5.0)
Triglycerides: 286 mg/dL — ABNORMAL HIGH (ref <150)

## 2022-11-24 LAB — TSH: TSH: 1.52 mIU/L (ref 0.40–4.50)

## 2022-11-24 LAB — COMPREHENSIVE METABOLIC PANEL
AG Ratio: 2.5 (calc) (ref 1.0–2.5)
ALT: 13 U/L (ref 9–46)
AST: 14 U/L (ref 10–40)
Albumin: 4.9 g/dL (ref 3.6–5.1)
Alkaline phosphatase (APISO): 65 U/L (ref 36–130)
BUN: 19 mg/dL (ref 7–25)
CO2: 20 mmol/L (ref 20–32)
Calcium: 9.6 mg/dL (ref 8.6–10.3)
Chloride: 108 mmol/L (ref 98–110)
Creat: 0.99 mg/dL (ref 0.60–1.29)
Globulin: 2 g/dL (calc) (ref 1.9–3.7)
Glucose, Bld: 88 mg/dL (ref 65–99)
Potassium: 4.1 mmol/L (ref 3.5–5.3)
Sodium: 140 mmol/L (ref 135–146)
Total Bilirubin: 2.9 mg/dL — ABNORMAL HIGH (ref 0.2–1.2)
Total Protein: 6.9 g/dL (ref 6.1–8.1)

## 2022-11-24 LAB — HAPTOGLOBIN: Haptoglobin: <8 mg/dL — ABNORMAL LOW (ref 43–212)

## 2022-11-24 LAB — HEMOGLOBIN A1C: Hgb A1c MFr Bld: <4.2 % of total Hgb (ref <5.7)

## 2022-11-24 LAB — VITAMIN B12: Vitamin B-12: 241 pg/mL (ref 200–1100)

## 2022-11-24 LAB — LACTATE DEHYDROGENASE: LDH: 240 U/L — ABNORMAL HIGH (ref 100–220)

## 2022-11-25 NOTE — Progress Notes (Signed)
Please inform patient of the following:  His labs are trending a bit better than last year however it still does look like he is still having issues with hemolytic anemia.  He should follow-up with the hematologist soon.  Please place referral if needed.  His HDL is a bit low but stable compared to previous.  The rest of his labs are all stable.  Do not need to make any other changes to his treatment plan at this time.  He should continue to work on diet and exercise and we can recheck his other labs in a year.

## 2022-11-26 ENCOUNTER — Encounter: Payer: Self-pay | Admitting: Family Medicine

## 2022-11-26 ENCOUNTER — Other Ambulatory Visit: Payer: Self-pay

## 2022-11-26 DIAGNOSIS — D591 Autoimmune hemolytic anemia, unspecified: Secondary | ICD-10-CM

## 2022-12-11 ENCOUNTER — Inpatient Hospital Stay: Payer: Self-pay | Admitting: Oncology

## 2023-01-28 ENCOUNTER — Other Ambulatory Visit: Payer: Self-pay

## 2023-02-11 ENCOUNTER — Other Ambulatory Visit: Payer: Self-pay | Admitting: Family Medicine

## 2023-02-11 NOTE — Telephone Encounter (Signed)
Last OV 01/31/21 Next OV not scheduled  Last refill 08/05/22 Qty #90/1  Denied - pt has not been seen in > 1 year.

## 2023-02-24 ENCOUNTER — Telehealth: Payer: Self-pay | Admitting: Family Medicine

## 2023-02-24 NOTE — Telephone Encounter (Signed)
Prescription Request  02/24/2023  LOV: 11/21/2022  What is the name of the medication or equipment?  busPIRone (BUSPAR) 10 MG tablet   Have you contacted your pharmacy to request a refill? Yes   Which pharmacy would you like this sent to?  [facility] [id number] [place], [place] - [address] [place] [address] Phone: [phone] Fax: [phone]    Patient notified that their request is being sent to the clinical staff for review and that they should receive a response within 2 business days.   Please advise at Mobile [phone] (mobile)

## 2023-02-26 ENCOUNTER — Other Ambulatory Visit: Payer: Self-pay | Admitting: *Deleted

## 2023-02-26 MED ORDER — BUSPIRONE HCL 10 MG PO TABS: 10.0000 mg | ORAL_TABLET | Freq: Two times a day (BID) | ORAL | 5 refills | Status: DC

## 2023-02-26 NOTE — Telephone Encounter (Signed)
Rx send to [facility]

## 2023-04-27 ENCOUNTER — Encounter (HOSPITAL_COMMUNITY): Payer: Self-pay | Admitting: Emergency Medicine

## 2023-04-27 ENCOUNTER — Other Ambulatory Visit: Payer: Self-pay

## 2023-04-27 ENCOUNTER — Emergency Department (HOSPITAL_COMMUNITY)
Admission: EM | Admit: 2023-04-27 | Discharge: 2023-04-27 | Disposition: A | Discharge status: Home / Self Care | Payer: Medicaid Other | Attending: Emergency Medicine | Admitting: Emergency Medicine

## 2023-04-27 ENCOUNTER — Emergency Department (HOSPITAL_COMMUNITY): Payer: Medicaid Other

## 2023-04-27 DIAGNOSIS — R109 Unspecified abdominal pain: Secondary | ICD-10-CM | POA: Diagnosis not present

## 2023-04-27 DIAGNOSIS — R9431 Abnormal electrocardiogram [ECG] [EKG]: Secondary | ICD-10-CM | POA: Diagnosis not present

## 2023-04-27 DIAGNOSIS — K5730 Diverticulosis of large intestine without perforation or abscess without bleeding: Secondary | ICD-10-CM | POA: Diagnosis not present

## 2023-04-27 DIAGNOSIS — D599 Acquired hemolytic anemia, unspecified: Secondary | ICD-10-CM

## 2023-04-27 DIAGNOSIS — R1011 Right upper quadrant pain: Secondary | ICD-10-CM | POA: Diagnosis not present

## 2023-04-27 DIAGNOSIS — R161 Splenomegaly, not elsewhere classified: Secondary | ICD-10-CM | POA: Diagnosis not present

## 2023-04-27 DIAGNOSIS — D649 Anemia, unspecified: Secondary | ICD-10-CM | POA: Diagnosis not present

## 2023-04-27 LAB — HEPATIC FUNCTION PANEL
ALT: 17 U/L (ref 0–44)
AST: 20 U/L (ref 15–41)
Albumin: 4.4 g/dL (ref 3.5–5.0)
Alkaline Phosphatase: 60 U/L (ref 38–126)
Bilirubin, Direct: 0.4 mg/dL — ABNORMAL HIGH (ref 0.0–0.2)
Indirect Bilirubin: 2 mg/dL — ABNORMAL HIGH (ref 0.3–0.9)
Total Bilirubin: 2.4 mg/dL — ABNORMAL HIGH (ref 0.3–1.2)
Total Protein: 6.8 g/dL (ref 6.5–8.1)

## 2023-04-27 LAB — COMPREHENSIVE METABOLIC PANEL
ALT: 17 U/L (ref 0–44)
AST: 20 U/L (ref 15–41)
Albumin: 4.6 g/dL (ref 3.5–5.0)
Alkaline Phosphatase: 59 U/L (ref 38–126)
Anion gap: 7 (ref 5–15)
BUN: 17 mg/dL (ref 6–20)
CO2: 21 mmol/L — ABNORMAL LOW (ref 22–32)
Calcium: 8.7 mg/dL — ABNORMAL LOW (ref 8.9–10.3)
Chloride: 111 mmol/L (ref 98–111)
Creatinine, Ser: 0.98 mg/dL (ref 0.61–1.24)
GFR, Estimated: >60 mL/min (ref >60)
Glucose, Bld: 125 mg/dL — ABNORMAL HIGH (ref 70–99)
Potassium: 3.8 mmol/L (ref 3.5–5.1)
Sodium: 139 mmol/L (ref 135–145)
Total Bilirubin: 2.3 mg/dL — ABNORMAL HIGH (ref 0.3–1.2)
Total Protein: 6.8 g/dL (ref 6.5–8.1)

## 2023-04-27 LAB — URINALYSIS, ROUTINE W REFLEX MICROSCOPIC
Bilirubin Urine: NEGATIVE
Glucose, UA: NEGATIVE mg/dL
Hgb urine dipstick: NEGATIVE
Ketones, ur: NEGATIVE mg/dL
Leukocytes,Ua: NEGATIVE
Nitrite: NEGATIVE
Protein, ur: NEGATIVE mg/dL
Specific Gravity, Urine: 1.024 (ref 1.005–1.030)
pH: 5 (ref 5.0–8.0)

## 2023-04-27 LAB — LIPASE, BLOOD: Lipase: 32 U/L (ref 11–51)

## 2023-04-27 LAB — CBC
HCT: 42.3 % (ref 39.0–52.0)
Hemoglobin: 13.8 g/dL (ref 13.0–17.0)
MCH: 33.2 pg (ref 26.0–34.0)
MCHC: 32.6 g/dL (ref 30.0–36.0)
MCV: 101.7 fL — ABNORMAL HIGH (ref 80.0–100.0)
Platelets: 131 10*3/uL — ABNORMAL LOW (ref 150–400)
RBC: 4.16 MIL/uL — ABNORMAL LOW (ref 4.22–5.81)
RDW: 12.1 % (ref 11.5–15.5)
WBC: 5.4 10*3/uL (ref 4.0–10.5)
nRBC: 0 % (ref 0.0–0.2)

## 2023-04-27 MED ORDER — DICYCLOMINE HCL 20 MG PO TABS: 20.0000 mg | ORAL_TABLET | Freq: Three times a day (TID) | ORAL | 2 refills | Status: AC | PRN

## 2023-04-27 MED ORDER — IOHEXOL 300 MG/ML  SOLN
100.0000 mL | Freq: Once | INTRAMUSCULAR | Status: AC | PRN
  Administered 2023-04-27: 100 mL via INTRAVENOUS

## 2023-04-27 MED ORDER — PANTOPRAZOLE SODIUM 20 MG PO TBEC: 20.0000 mg | DELAYED_RELEASE_TABLET | Freq: Every day | ORAL | 2 refills | Status: AC

## 2023-04-27 MED ORDER — FENTANYL CITRATE PF 50 MCG/ML IJ SOSY
50.0000 ug | PREFILLED_SYRINGE | Freq: Once | INTRAMUSCULAR | Status: AC
  Administered 2023-04-27: 50 ug via INTRAVENOUS
  Filled 2023-04-27: qty 1

## 2023-04-27 MED ORDER — SUCRALFATE 1 G PO TABS: 1.0000 g | ORAL_TABLET | Freq: Three times a day (TID) | ORAL | 2 refills | Status: DC

## 2023-04-27 MED ORDER — ONDANSETRON HCL 4 MG/2ML IJ SOLN
4.0000 mg | Freq: Once | INTRAMUSCULAR | Status: AC
  Administered 2023-04-27: 4 mg via INTRAVENOUS
  Filled 2023-04-27: qty 2

## 2023-04-27 NOTE — ED Provider Notes (Incomplete)
[place] EMERGENCY DEPARTMENT AT [facility] Provider Note   CSN: [id number] Arrival date & time: 04/27/23  1512     History {Add pertinent medical, surgical, social history, OB history to HPI:1} Chief Complaint  Patient presents with  . Abdominal Pain    Scott Watts is a 42 y.o. male.   Abdominal Pain      Home Medications Prior to Admission medications   Medication Sig Start Date End Date Taking? Authorizing Provider  busPIRone (BUSPAR) 10 MG tablet Take 1 tablet (10 mg total) by mouth 2 (two) times daily. 02/26/23   Ardith Dark, MD  clonazePAM (KLONOPIN) 1 MG tablet Take 1 tablet (1 mg total) by mouth 2 (two) times daily as needed for anxiety. 10/28/22   Ardith Dark, MD  Emollient (COLLAGEN EX) Apply 1 Application topically daily.    [provider]  hydrOXYzine (ATARAX) 25 MG tablet Take 1 tablet (25 mg total) by mouth at bedtime as needed (insomnia). 08/05/22   Rodolph Bong, MD  ibuprofen (ADVIL) 200 MG tablet Take 200 mg by mouth every 6 (six) hours as needed for headache or mild pain.    [provider]  tadalafil (CIALIS) 20 MG tablet Take 1 tablet (20 mg total) by mouth daily as needed for erectile dysfunction. 08/04/22   Ardith Dark, MD      Allergies    Patient has no known allergies.    Review of Systems   Review of Systems  Gastrointestinal:  Positive for abdominal pain.    Physical Exam Updated Vital Signs BP 127/78 (BP Location: Right Arm)   Pulse 66   Temp 98 F (36.7 C) (Oral)   Resp 16   Ht 6\' 3"  (1.905 m)   Wt 87.1 kg   SpO2 96%   BMI 24.00 kg/m  Physical Exam  ED Results / Procedures / Treatments   Labs (all labs ordered are listed, but only abnormal results are displayed) Labs Reviewed  COMPREHENSIVE METABOLIC PANEL - Abnormal; Notable for the following components:      Result Value   CO2 21 (*)    Glucose, Bld 125 (*)    Calcium 8.7 (*)    Total Bilirubin 2.3 (*)    All other  components within normal limits  CBC - Abnormal; Notable for the following components:   RBC 4.16 (*)    MCV 101.7 (*)    Platelets 131 (*)    All other components within normal limits  HEPATIC FUNCTION PANEL - Abnormal; Notable for the following components:   Total Bilirubin 2.4 (*)    Bilirubin, Direct 0.4 (*)    Indirect Bilirubin 2.0 (*)    All other components within normal limits  LIPASE, BLOOD  URINALYSIS, ROUTINE W REFLEX MICROSCOPIC    EKG None  Radiology CT ABDOMEN PELVIS W CONTRAST  Result Date: 04/27/2023 CLINICAL DATA:  Acute abdominal pain EXAM: CT ABDOMEN AND PELVIS WITH CONTRAST TECHNIQUE: Multidetector CT imaging of the abdomen and pelvis was performed using the standard protocol following bolus administration of intravenous contrast. RADIATION DOSE REDUCTION: This exam was performed according to the departmental dose-optimization program which includes automated exposure control, adjustment of the mA and/or kV according to patient size and/or use of iterative reconstruction technique. CONTRAST:  OMNIPAQUE IOHEXOL 300 MG/ML  SOLN COMPARISON:  MRI abdomen 07/29/2022 FINDINGS: Lower chest: No acute abnormality. Hepatobiliary: No focal liver abnormality is seen. Status post cholecystectomy. No biliary dilatation. Pancreas: Unremarkable. No pancreatic  ductal dilatation or surrounding inflammatory changes. Spleen: Mildly enlarged, similar to prior. Adrenals/Urinary Tract: Adrenal glands are unremarkable. Kidneys are normal, without renal calculi, focal lesion, or hydronephrosis. Bladder is unremarkable. Stomach/Bowel: There is sigmoid colon diverticulosis. Vascular/Lymphatic: No significant vascular findings are present. No enlarged abdominal or pelvic lymph nodes. Reproductive: Prostate is unremarkable. Other: No abdominal wall hernia or abnormality. No abdominopelvic ascites. Musculoskeletal: No acute or significant osseous findings. IMPRESSION: 1. No acute process in the  abdomen or pelvis. 2. Sigmoid colon diverticulosis without evidence for diverticulitis. 3. Stable mild splenomegaly. Electronically Signed   By: Darliss Cheney M.D.   On: 04/27/2023 21:35    Procedures Procedures  {Document cardiac monitor, telemetry assessment procedure when appropriate:1}  Medications Ordered in ED Medications  iohexol (OMNIPAQUE) 300 MG/ML solution 100 mL (100 mLs Intravenous Contrast Given 04/27/23 2120)    ED Course/ Medical Decision Making/ A&P Clinical Course as of 04/27/23 2335  Mon Apr 27, 2023  1959 Glucose(!): 125 [AH]  1959 Calcium(!): 8.7 [AH]  1959 Total Bilirubin(!): 2.3 [AH]  2105 Indirect Bilirubin(!): 2.0 [AH]  2236 Stable  41 yom with a chief complaint of RUQ pain. Hx of AHA. CTAP reassuring.  Unconjungated bilirubinemia. [CC]  2237 PPI and carafate will plan to follow up with GI. [CC]    Clinical Course User Index [AH] Arthor Captain, PA-C [CC] Glyn Ade, MD   {   Click here for ABCD2, HEART and other calculatorsREFRESH Note before signing :1}                          Medical Decision Making Amount and/or Complexity of Data Reviewed Labs: ordered. Decision-making details documented in ED Course. Radiology: ordered.  Risk Prescription drug management.   ***  {Document critical care time when appropriate:1} {Document review of labs and clinical decision tools ie heart score, Chads2Vasc2 etc:1}  {Document your independent review of radiology images, and any outside records:1} {Document your discussion with family members, caretakers, and with consultants:1} {Document social determinants of health affecting pt's care:1} {Document your decision making why or why not admission, treatments were needed:1} Final Clinical Impression(s) / ED Diagnoses Final diagnoses:  None    Rx / DC Orders ED Discharge Orders     None

## 2023-04-27 NOTE — ED Triage Notes (Signed)
Patient c/o abdominal pain x 1 month. Patient worsening RUQ abdominal pain for past 3 days.  Patient denies N/V. Patient denies fever. Hx of gall bladder removed last fall.

## 2023-04-27 NOTE — ED Notes (Signed)
Pt gave me urine sample while waiting in lobby, urine sample was placed in triage in urine bucket (yellow) with label.

## 2023-04-27 NOTE — ED Provider Notes (Signed)
[place] EMERGENCY DEPARTMENT AT [facility] Provider Note   CSN: [id number] Arrival date & time: 04/27/23  1512     History {Add pertinent medical, surgical, social history, OB history to HPI:1} Chief Complaint  Patient presents with   Abdominal Pain    Scott Watts is a 42 y.o. male.   Abdominal Pain      Home Medications Prior to Admission medications   Medication Sig Start Date End Date Taking? Authorizing Provider  busPIRone (BUSPAR) 10 MG tablet Take 1 tablet (10 mg total) by mouth 2 (two) times daily. 02/26/23   Ardith Dark, MD  clonazePAM (KLONOPIN) 1 MG tablet Take 1 tablet (1 mg total) by mouth 2 (two) times daily as needed for anxiety. 10/28/22   Ardith Dark, MD  Emollient (COLLAGEN EX) Apply 1 Application topically daily.    [provider]  hydrOXYzine (ATARAX) 25 MG tablet Take 1 tablet (25 mg total) by mouth at bedtime as needed (insomnia). 08/05/22   Rodolph Bong, MD  ibuprofen (ADVIL) 200 MG tablet Take 200 mg by mouth every 6 (six) hours as needed for headache or mild pain.    [provider]  tadalafil (CIALIS) 20 MG tablet Take 1 tablet (20 mg total) by mouth daily as needed for erectile dysfunction. 08/04/22   Ardith Dark, MD      Allergies    Patient has no known allergies.    Review of Systems   Review of Systems  Gastrointestinal:  Positive for abdominal pain.    Physical Exam Updated Vital Signs BP 127/78 (BP Location: Right Arm)   Pulse 66   Temp 98 F (36.7 C) (Oral)   Resp 16   Ht 6\' 3"  (1.905 m)   Wt 87.1 kg   SpO2 96%   BMI 24.00 kg/m  Physical Exam  ED Results / Procedures / Treatments   Labs (all labs ordered are listed, but only abnormal results are displayed) Labs Reviewed  COMPREHENSIVE METABOLIC PANEL - Abnormal; Notable for the following components:      Result Value   CO2 21 (*)    Glucose, Bld 125 (*)    Calcium 8.7 (*)    Total Bilirubin 2.3 (*)    All other  components within normal limits  CBC - Abnormal; Notable for the following components:   RBC 4.16 (*)    MCV 101.7 (*)    Platelets 131 (*)    All other components within normal limits  HEPATIC FUNCTION PANEL - Abnormal; Notable for the following components:   Total Bilirubin 2.4 (*)    Bilirubin, Direct 0.4 (*)    Indirect Bilirubin 2.0 (*)    All other components within normal limits  LIPASE, BLOOD  URINALYSIS, ROUTINE W REFLEX MICROSCOPIC    EKG None  Radiology No results found.  Procedures Procedures  {Document cardiac monitor, telemetry assessment procedure when appropriate:1}  Medications Ordered in ED Medications - No data to display  ED Course/ Medical Decision Making/ A&P Clinical Course as of 04/27/23 2106  Mon Apr 27, 2023  1959 Glucose(!): 125 [AH]  1959 Calcium(!): 8.7 [AH]  1959 Total Bilirubin(!): 2.3 [AH]  2105 Indirect Bilirubin(!): 2.0 [AH]    Clinical Course User Index [AH] Arthor Captain, PA-C   {   Click here for ABCD2, HEART and other calculatorsREFRESH Note before signing :1}  Medical Decision Making Amount and/or Complexity of Data Reviewed Labs: ordered. Decision-making details documented in ED Course. Radiology: ordered.   ***  {Document critical care time when appropriate:1} {Document review of labs and clinical decision tools ie heart score, Chads2Vasc2 etc:1}  {Document your independent review of radiology images, and any outside records:1} {Document your discussion with family members, caretakers, and with consultants:1} {Document social determinants of health affecting pt's care:1} {Document your decision making why or why not admission, treatments were needed:1} Final Clinical Impression(s) / ED Diagnoses Final diagnoses:  None    Rx / DC Orders ED Discharge Orders     None

## 2023-04-27 NOTE — Discharge Instructions (Addendum)
Abdominal (belly) pain can be caused by many things. Your caregiver performed an examination and possibly ordered blood/urine tests and imaging (CT scan, x-rays, ultrasound). Many cases can be observed and treated at home after initial evaluation in the emergency department. Even though you are being discharged home, abdominal pain can be unpredictable. Therefore, you need a repeated exam if your pain does not resolve, returns, or worsens. Most patients with abdominal pain don't have to be admitted to the hospital or have surgery, but serious problems like appendicitis and gallbladder attacks can start out as nonspecific pain. Many abdominal conditions cannot be diagnosed in one visit, so follow-up evaluations are very important. °SEEK IMMEDIATE MEDICAL ATTENTION IF: °The pain does not go away or becomes severe.  °A temperature above 101 develops.  °Repeated vomiting occurs (multiple episodes).  °The pain becomes localized to portions of the abdomen. The right side could possibly be appendicitis. In an adult, the left lower portion of the abdomen could be colitis or diverticulitis.  °Blood is being passed in stools or vomit (bright red or black tarry stools).  °Return also if you develop chest pain, difficulty breathing, dizziness or fainting, or become confused, poorly responsive, or inconsolable (young children). ° °

## 2023-05-20 DIAGNOSIS — Z9049 Acquired absence of other specified parts of digestive tract: Secondary | ICD-10-CM | POA: Diagnosis not present

## 2023-05-20 DIAGNOSIS — Z1211 Encounter for screening for malignant neoplasm of colon: Secondary | ICD-10-CM | POA: Diagnosis not present

## 2023-05-20 DIAGNOSIS — R1013 Epigastric pain: Secondary | ICD-10-CM | POA: Diagnosis not present

## 2023-05-20 DIAGNOSIS — Z791 Long term (current) use of non-steroidal anti-inflammatories (NSAID): Secondary | ICD-10-CM | POA: Diagnosis not present

## 2023-05-20 DIAGNOSIS — Z1212 Encounter for screening for malignant neoplasm of rectum: Secondary | ICD-10-CM | POA: Diagnosis not present

## 2023-06-04 ENCOUNTER — Encounter: Payer: Self-pay | Admitting: Family Medicine

## 2023-06-04 ENCOUNTER — Ambulatory Visit: Payer: Medicaid Other | Admitting: Family Medicine

## 2023-06-04 VITALS — BP 114/66 | HR 73 | Temp 97.9°F | Resp 17 | Ht 75.0 in | Wt 202.4 lb

## 2023-06-04 DIAGNOSIS — Z1152 Encounter for screening for COVID-19: Secondary | ICD-10-CM | POA: Diagnosis not present

## 2023-06-04 DIAGNOSIS — B9689 Other specified bacterial agents as the cause of diseases classified elsewhere: Secondary | ICD-10-CM

## 2023-06-04 DIAGNOSIS — J329 Chronic sinusitis, unspecified: Secondary | ICD-10-CM

## 2023-06-04 LAB — POC COVID19 BINAXNOW: SARS Coronavirus 2 Ag: NEGATIVE

## 2023-06-04 MED ORDER — AMOXICILLIN 875 MG PO TABS: 875.0000 mg | ORAL_TABLET | Freq: Two times a day (BID) | ORAL | 0 refills | Status: AC

## 2023-06-04 NOTE — Progress Notes (Signed)
   Subjective:    Patient ID: Scott Watts, male    DOB: 14-Mar-1981, 42 y.o.   MRN: [id number]  HPI URI- sxs started w/ 'scratch' a few days ago.  + dry cough.  Today developed HA, sinus congestion.  No fever.  Denies body aches, no chills.  Was around large groups of people last weekend.  + maxillary sinus pressure.  Pt reports hx of similar and has previously been dx'd w/ sinus infxn.   Review of Systems For ROS see HPI     Objective:   Physical Exam Vitals reviewed.  Constitutional:      General: He is not in acute distress.    Appearance: Normal appearance. He is well-developed. He is not ill-appearing.  HENT:     Head: Normocephalic and atraumatic.     Right Ear: Tympanic membrane normal.     Left Ear: Tympanic membrane normal.     Nose: Mucosal edema and congestion present. No rhinorrhea.     Right Sinus: Maxillary sinus tenderness and frontal sinus tenderness present.     Left Sinus: Maxillary sinus tenderness and frontal sinus tenderness present.     Mouth/Throat:     Mouth: Mucous membranes are normal.     Pharynx: No posterior oropharyngeal edema.  Eyes:     Extraocular Movements: EOM normal.     Conjunctiva/sclera: Conjunctivae normal.     Pupils: Pupils are equal, round, and reactive to light.  Cardiovascular:     Rate and Rhythm: Normal rate and regular rhythm.     Heart sounds: Normal heart sounds.  Pulmonary:     Effort: Pulmonary effort is normal. No respiratory distress.     Breath sounds: Normal breath sounds. No wheezing.  Musculoskeletal:     Cervical back: Normal range of motion and neck supple.  Lymphadenopathy:     Cervical: No cervical adenopathy.  Skin:    General: Skin is warm and dry.  Neurological:     Mental Status: He is alert.           Assessment & Plan:  Bacterial sinusitis- new.  Pt's COVID test is negative.  Sxs are consistent w/ previous sinus infxns that have required abx.  Start Amoxicillin BID x10 days.  Reviewed supportive  care and red flags that should prompt return.  Pt expressed understanding and is in agreement w/ plan.

## 2023-06-04 NOTE — Patient Instructions (Signed)
Follow up as needed or as scheduled START the Amoxicillin twice daily- take w/ food Drink LOTS of fluids REST! Ibuprofen for sore throat or headache Call with any questions or concerns Hang in there!!!

## 2023-07-20 ENCOUNTER — Other Ambulatory Visit: Payer: Self-pay | Admitting: Family Medicine

## 2023-07-20 NOTE — Telephone Encounter (Signed)
Prescription Request  07/20/2023  LOV: 11/21/2022  What is the name of the medication or equipment? clonazePAM (KLONOPIN) 1 MG tablet  hydrOXYzine (ATARAX) 25 MG tablet  tadalafil (CIALIS) 20 MG tablet   Have you contacted your pharmacy to request a refill? Yes   Which pharmacy would you like this sent to?  [facility] [id number] [place], [place] - [address] [place] [address] Phone: [phone] Fax: [phone]    Patient notified that their request is being sent to the clinical staff for review and that they should receive a response within 2 business days.   Please advise at Mobile [phone] (mobile)

## 2023-07-21 ENCOUNTER — Other Ambulatory Visit: Payer: Self-pay | Admitting: *Deleted

## 2023-07-21 MED ORDER — TADALAFIL 20 MG PO TABS: 20.0000 mg | ORAL_TABLET | Freq: Every day | ORAL | 11 refills | Status: DC | PRN

## 2023-07-21 MED ORDER — CLONAZEPAM 1 MG PO TABS: 1.0000 mg | ORAL_TABLET | Freq: Two times a day (BID) | ORAL | 5 refills | Status: AC | PRN

## 2023-07-21 MED ORDER — HYDROXYZINE HCL 25 MG PO TABS: 25.0000 mg | ORAL_TABLET | Freq: Every evening | ORAL | 1 refills | Status: DC | PRN

## 2023-07-21 NOTE — Telephone Encounter (Signed)
Pt has not been seen since 2022. Refill not appropriate.

## 2023-07-28 DIAGNOSIS — R1013 Epigastric pain: Secondary | ICD-10-CM | POA: Diagnosis not present

## 2023-08-20 ENCOUNTER — Encounter: Payer: Self-pay | Admitting: Physician Assistant

## 2023-08-20 ENCOUNTER — Ambulatory Visit: Payer: Medicaid Other | Admitting: Physician Assistant

## 2023-08-20 VITALS — BP 130/74 | HR 71 | Temp 97.7°F | Ht 75.0 in | Wt 211.0 lb

## 2023-08-20 DIAGNOSIS — J3489 Other specified disorders of nose and nasal sinuses: Secondary | ICD-10-CM

## 2023-08-20 LAB — POC COVID19 BINAXNOW: SARS Coronavirus 2 Ag: NEGATIVE

## 2023-08-20 MED ORDER — AMOXICILLIN-POT CLAVULANATE 875-125 MG PO TABS: 1.0000 | ORAL_TABLET | Freq: Two times a day (BID) | ORAL | 0 refills | Status: AC

## 2023-08-20 NOTE — Progress Notes (Signed)
Scott Watts is a 42 y.o. male here for a new problem.  History of Present Illness:   Chief Complaint  Patient presents with   Sinus Problem    Pt c/o sinus pressure, headache, post nasal drip, chest congestion, clear to light green nasal drainage. Started yesterday. Denies fever or chills   HPI  Congestion: Complains of sinus congestion with associated headache, post nasal drip, chest congestion, rhinorrhea, and dry mouth. Says symptoms started two nights ago but worsened yesterday when he woke up.  He notes he had potential sick contacts this weekend.  Using a Neti Pot for his symptoms, and has been trying to drink sufficient fluids.  He asks whether he will be contagious.  Denies fever, chills, or recent travel.   Past Medical History:  Diagnosis Date   Elevated bilirubin    Low serum vitamin B12     Social History   Tobacco Use   Smoking status: Every Day    Current packs/day: 0.00    Types: Cigarettes    Last attempt to quit: 02/28/2020    Years since quitting: 3.4   Smokeless tobacco: Never   Tobacco comments:    0.5 ppd  Vaping Use   Vaping status: Never Used  Substance Use Topics   Alcohol use: Yes    Alcohol/week: 2.0 standard drinks of alcohol    Types: 2 Cans of beer per week   Drug use: Never   Past Surgical History:  Procedure Laterality Date   CHOLECYSTECTOMY N/A 07/30/2022   Procedure: LAPAROSCOPIC CHOLECYSTECTOMY WITH INTRAOPERATIVE CHOLANGIOGRAM;  Surgeon: Axel Filler, MD;  Location: [facility];  Service: General;  Laterality: N/A;   COLONOSCOPY  2014   CONDYLOMA EXCISION/FULGURATION     orthodontics surgery     Family History  Problem Relation Age of Onset   Cerebral aneurysm Mother    Alcohol abuse Father    Cancer Neg Hx    No Known Allergies Current Medications:   Current Outpatient Medications:    amoxicillin-clavulanate (AUGMENTIN) 875-125 MG tablet, Take 1 tablet by mouth 2 (two) times daily., Disp: 14 tablet, Rfl: 0   busPIRone  (BUSPAR) 10 MG tablet, Take 1 tablet (10 mg total) by mouth 2 (two) times daily., Disp: 60 tablet, Rfl: 5   clonazePAM (KLONOPIN) 1 MG tablet, Take 1 tablet (1 mg total) by mouth 2 (two) times daily as needed for anxiety., Disp: 60 tablet, Rfl: 5   dicyclomine (BENTYL) 20 MG tablet, Take 1 tablet (20 mg total) by mouth 3 (three) times daily as needed for spasms., Disp: 30 tablet, Rfl: 2   hydrOXYzine (ATARAX) 25 MG tablet, Take 1 tablet (25 mg total) by mouth at bedtime as needed (insomnia)., Disp: 90 tablet, Rfl: 1   ibuprofen (ADVIL) 200 MG tablet, Take 200 mg by mouth every 6 (six) hours as needed for headache or mild pain., Disp: , Rfl:    pantoprazole (PROTONIX) 20 MG tablet, Take 1 tablet (20 mg total) by mouth daily., Disp: 30 tablet, Rfl: 2   tadalafil (CIALIS) 20 MG tablet, Take 1 tablet (20 mg total) by mouth daily as needed for erectile dysfunction., Disp: 30 tablet, Rfl: 11  Review of Systems:   ROS See pertinent positives and negatives as per the HPI.  Vitals:   Vitals:   08/20/23 1126  BP: 130/74  Pulse: 71  Temp: 97.7 F (36.5 C)  TempSrc: Temporal  Weight: 211 lb (95.7 kg)  Height: 6\' 3"  (1.905 m)     Body  mass index is 26.37 kg/m.  Physical Exam:   Physical Exam Vitals and nursing note reviewed.  Constitutional:      General: He is not in acute distress.    Appearance: He is well-developed. He is not ill-appearing or toxic-appearing.  HENT:     Head: Normocephalic and atraumatic.     Right Ear: Tympanic membrane, ear canal and external ear normal. Tympanic membrane is not erythematous, retracted or bulging.     Left Ear: Tympanic membrane, ear canal and external ear normal. Tympanic membrane is not erythematous, retracted or bulging.     Nose: Nose normal.     Right Sinus: No maxillary sinus tenderness or frontal sinus tenderness.     Left Sinus: No maxillary sinus tenderness or frontal sinus tenderness.     Mouth/Throat:     Pharynx: Uvula midline. No  posterior oropharyngeal erythema.  Eyes:     General: Lids are normal.     Conjunctiva/sclera: Conjunctivae normal.  Neck:     Trachea: Trachea normal.  Cardiovascular:     Rate and Rhythm: Normal rate and regular rhythm.     Pulses: Normal pulses.     Heart sounds: Normal heart sounds, S1 normal and S2 normal.  Pulmonary:     Effort: Pulmonary effort is normal.     Breath sounds: Normal breath sounds. No decreased breath sounds, wheezing, rhonchi or rales.  Lymphadenopathy:     Cervical: No cervical adenopathy.  Skin:    General: Skin is warm and dry.  Neurological:     Mental Status: He is alert.     GCS: GCS eye subscore is 4. GCS verbal subscore is 5. GCS motor subscore is 6.  Psychiatric:        Speech: Speech normal.        Behavior: Behavior normal. Behavior is cooperative.     Assessment and Plan:   Sinus pressure No red flags on discussion, patient is not in any obvious distress during our visit. Discussed progression of most viral illness, and recommended supportive care at this point in time. I did however provide pocket rx for oral augmentin should symptoms not improve as anticipated. Discussed over the counter supportive care options, with recommendations to push fluids and rest. Discussed use of nasal saline, nasal sprays and Neti-pot. May take mucinex for congestion.  Reviewed return precautions including new/worsening fever, SOB, new/worsening cough or other concerns.  Recommended need to self-quarantine and practice social distancing until symptoms resolve. Discussed current recommendations for COVID testing. I recommend that patient follow-up if symptoms worsen or persist despite treatment x 7-10 days, sooner if needed.  I,Emily Lagle,acting as a Neurosurgeon for [employer], PA.,have documented all relevant documentation on the behalf of Jarold Motto, PA,as directed by  Jarold Motto, PA while in the presence of Jarold Motto, [place].  I, Jarold Motto,  [place], have reviewed all documentation for this visit. The documentation on 08/20/23 for the exam, diagnosis, procedures, and orders are all accurate and complete.  Jarold Motto, PA-C

## 2023-08-30 ENCOUNTER — Other Ambulatory Visit: Payer: Self-pay | Admitting: Family Medicine

## 2023-10-05 ENCOUNTER — Emergency Department (HOSPITAL_COMMUNITY)
Admission: EM | Admit: 2023-10-05 | Discharge: 2023-10-05 | Disposition: A | Discharge status: Home / Self Care | Payer: Medicaid Other | Attending: Emergency Medicine | Admitting: Emergency Medicine

## 2023-10-05 ENCOUNTER — Emergency Department (HOSPITAL_COMMUNITY): Payer: Medicaid Other

## 2023-10-05 DIAGNOSIS — R059 Cough, unspecified: Secondary | ICD-10-CM | POA: Insufficient documentation

## 2023-10-05 DIAGNOSIS — R519 Headache, unspecified: Secondary | ICD-10-CM | POA: Insufficient documentation

## 2023-10-05 DIAGNOSIS — R000 Tachycardia, unspecified: Secondary | ICD-10-CM | POA: Insufficient documentation

## 2023-10-05 DIAGNOSIS — R0602 Shortness of breath: Secondary | ICD-10-CM

## 2023-10-05 MED ORDER — ACETAMINOPHEN 500 MG PO TABS
1000.0000 mg | ORAL_TABLET | Freq: Once | ORAL | Status: AC
  Administered 2023-10-05: 1000 mg via ORAL
  Filled 2023-10-05: qty 2

## 2023-10-05 MED ORDER — IBUPROFEN 200 MG PO TABS
600.0000 mg | ORAL_TABLET | Freq: Once | ORAL | Status: AC
  Administered 2023-10-05: 600 mg via ORAL
  Filled 2023-10-05: qty 3

## 2023-10-05 MED ORDER — ALBUTEROL SULFATE HFA 108 (90 BASE) MCG/ACT IN AERS
2.0000 | INHALATION_SPRAY | Freq: Once | RESPIRATORY_TRACT | Status: AC
  Administered 2023-10-05: 2 via RESPIRATORY_TRACT
  Filled 2023-10-05: qty 6.7

## 2023-10-05 NOTE — ED Provider Notes (Cosign Needed)
[place] EMERGENCY DEPARTMENT AT [facility] Provider Note   CSN: [id number] Arrival date & time: 10/05/23  0009     History  Chief Complaint  Patient presents with   Shortness of Breath    Scott Watts is a 42 y.o. male.  Patient here for evaluation of symptoms that started as a headache 5 days ago and have progressed to include nasal congestion, cough, chest pain, fever. He took a COVID test at home which is reported as positive. He states he has a pulse oximeter at home and that his oxygen level was 87%, prompting ED evaluation. No vomiting.   The history is provided by the patient. No language interpreter was used.  Shortness of Breath      Home Medications Prior to Admission medications   Medication Sig Start Date End Date Taking? Authorizing Provider  amoxicillin-clavulanate (AUGMENTIN) 875-125 MG tablet Take 1 tablet by mouth 2 (two) times daily. 08/20/23   Jarold Motto, PA  busPIRone (BUSPAR) 10 MG tablet TAKE 1 TABLET BY MOUTH 2 TIMES A DAY 08/31/23   Ardith Dark, MD  clonazePAM (KLONOPIN) 1 MG tablet Take 1 tablet (1 mg total) by mouth 2 (two) times daily as needed for anxiety. 07/21/23   Ardith Dark, MD  dicyclomine (BENTYL) 20 MG tablet Take 1 tablet (20 mg total) by mouth 3 (three) times daily as needed for spasms. 04/27/23   Arthor Captain, PA-C  hydrOXYzine (ATARAX) 25 MG tablet Take 1 tablet (25 mg total) by mouth at bedtime as needed (insomnia). 07/21/23   Ardith Dark, MD  ibuprofen (ADVIL) 200 MG tablet Take 200 mg by mouth every 6 (six) hours as needed for headache or mild pain.    [provider]  pantoprazole (PROTONIX) 20 MG tablet Take 1 tablet (20 mg total) by mouth daily. 04/27/23   Arthor Captain, PA-C  tadalafil (CIALIS) 20 MG tablet Take 1 tablet (20 mg total) by mouth daily as needed for erectile dysfunction. 07/21/23   Ardith Dark, MD      Allergies    Patient has no known allergies.    Review of  Systems   Review of Systems  Respiratory:  Positive for shortness of breath.     Physical Exam Updated Vital Signs BP (!) 141/78   Pulse (!) 104   Temp 99.6 F (37.6 C) (Oral)   Resp 18   SpO2 96%  Physical Exam Constitutional:      General: He is not in acute distress.    Appearance: He is well-developed.  HENT:     Head: Normocephalic.  Cardiovascular:     Rate and Rhythm: Regular rhythm. Tachycardia present.     Heart sounds: No murmur heard. Pulmonary:     Effort: Pulmonary effort is normal.     Breath sounds: Normal breath sounds. No wheezing, rhonchi or rales.  Abdominal:     General: Bowel sounds are normal.     Palpations: Abdomen is soft.     Tenderness: There is no abdominal tenderness. There is no guarding or rebound.  Musculoskeletal:        General: Normal range of motion.     Cervical back: Normal range of motion and neck supple.  Skin:    General: Skin is warm and dry.  Neurological:     Mental Status: He is alert and oriented to person, place, and time.     ED Results / Procedures / Treatments   Labs (all labs  ordered are listed, but only abnormal results are displayed) Labs Reviewed - No data to display  EKG None  Radiology DG Chest 2 View Result Date: 10/05/2023 CLINICAL DATA:  Shortness of breath EXAM: CHEST - 2 VIEW COMPARISON:  09/06/2020 FINDINGS: The heart size and mediastinal contours are within normal limits. Both lungs are clear. The visualized skeletal structures are unremarkable. IMPRESSION: No active cardiopulmonary disease. Electronically Signed   By: Alcide Clever M.D.   On: 10/05/2023 01:20    Procedures Procedures    Medications Ordered in ED Medications  acetaminophen (TYLENOL) tablet 1,000 mg (1,000 mg Oral Given 10/05/23 0323)  ibuprofen (ADVIL) tablet 600 mg (600 mg Oral Given 10/05/23 0323)  albuterol (VENTOLIN HFA) 108 (90 Base) MCG/ACT inhaler 2 puff (2 puffs Inhalation Given 10/05/23 0323)    ED Course/ Medical  Decision Making/ A&P Clinical Course as of 10/05/23 0347  Mon Oct 05, 2023  0304 Patient with COVID positive test at home with symptoms of cough, fever Tmax 103, SOB, reporting low O2 saturation of 87% at home. On exam, he is tachycardic to 114 (monitor), low grade temp in ED. Will treat with ibuprofen and Tylenol, give 2 puffs albuterol inhaler. He has been on continuous pulse ox with readings no less than 94%. Will re-evaluate after medications. Anticipate discharge home.  [SU]  [id number] Patient staying 92% and above during observation. Reports he feels a bit better with Albuterol inhaler. Headache and fever improved with Tylenol and ibuprofen. He feels comfortable with discharge home. Low threshold to return to ED with any worsening symptoms.  [SU]    Clinical Course User Index [SU] Elpidio Anis, PA-C                                 Medical Decision Making Amount and/or Complexity of Data Reviewed Radiology: ordered.  Risk OTC drugs. Prescription drug management.           Final Clinical Impression(s) / ED Diagnoses Final diagnoses:  Shortness of breath    Rx / DC Orders ED Discharge Orders     None         Elpidio Anis, PA-C 10/05/23 6962

## 2023-10-05 NOTE — ED Triage Notes (Signed)
Patient arrived stating he has had difficulty breathing, fever, and body aches.  Reports home covid test read positive. Symptoms started three days ago. Last dose of Tylenol at 2pm.

## 2023-10-05 NOTE — Discharge Instructions (Signed)
Return to the ED with any new or worsening symptoms.   Use the inhaler, 2 puffs every 4-6 hours, if this helps with cough or shortness of breath. Take tylenol and ibuprofen for aches and fever. Push fluids to avoid dehydration.

## 2024-01-21 ENCOUNTER — Ambulatory Visit: Admitting: Family Medicine

## 2024-01-21 ENCOUNTER — Encounter: Payer: Self-pay | Admitting: Family Medicine

## 2024-01-21 VITALS — BP 121/69 | HR 76 | Temp 97.7°F | Ht 75.0 in | Wt 215.4 lb

## 2024-01-21 DIAGNOSIS — R519 Headache, unspecified: Secondary | ICD-10-CM | POA: Diagnosis not present

## 2024-01-21 DIAGNOSIS — R509 Fever, unspecified: Secondary | ICD-10-CM | POA: Diagnosis not present

## 2024-01-21 LAB — POCT INFLUENZA A/B
Influenza A, POC: NEGATIVE
Influenza B, POC: NEGATIVE

## 2024-01-21 LAB — POC COVID19 BINAXNOW: SARS Coronavirus 2 Ag: NEGATIVE

## 2024-01-21 MED ORDER — AZITHROMYCIN 250 MG PO TABS: ORAL_TABLET | ORAL | 0 refills | Status: AC

## 2024-01-21 NOTE — Patient Instructions (Signed)
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
## 2024-01-31 ENCOUNTER — Other Ambulatory Visit: Payer: Self-pay | Admitting: Family Medicine

## 2024-02-26 ENCOUNTER — Encounter: Payer: Self-pay | Admitting: Family Medicine

## 2024-02-27 ENCOUNTER — Other Ambulatory Visit: Payer: Self-pay | Admitting: Family Medicine

## 2024-03-28 ENCOUNTER — Other Ambulatory Visit: Payer: Self-pay | Admitting: Family Medicine

## 2024-03-29 ENCOUNTER — Encounter: Admitting: Family Medicine

## 2024-05-20 ENCOUNTER — Other Ambulatory Visit: Payer: Self-pay | Admitting: Family Medicine

## 2024-05-31 ENCOUNTER — Other Ambulatory Visit: Payer: Self-pay | Admitting: Family Medicine

## 2024-05-31 NOTE — Telephone Encounter (Signed)
 Requesting: hydrOXYzine  HCL 25 MG TABLET  Last Visit: 01/21/2024 Next Visit: Visit date not found Last Refill: 03/28/2024  Please Advise

## 2024-06-01 NOTE — Telephone Encounter (Signed)
 Needs office visit.

## 2024-06-25 ENCOUNTER — Other Ambulatory Visit: Payer: Self-pay | Admitting: Family Medicine

## 2024-07-15 ENCOUNTER — Other Ambulatory Visit: Payer: Self-pay | Admitting: Family Medicine

## 2024-08-01 ENCOUNTER — Other Ambulatory Visit: Payer: Self-pay | Admitting: Family Medicine

## 2024-08-07 ENCOUNTER — Other Ambulatory Visit: Payer: Self-pay | Admitting: Family Medicine

## 2024-08-21 ENCOUNTER — Other Ambulatory Visit: Payer: Self-pay | Admitting: Family Medicine

## 2024-08-31 ENCOUNTER — Other Ambulatory Visit: Payer: Self-pay | Admitting: Family Medicine

## 2024-09-02 ENCOUNTER — Other Ambulatory Visit: Payer: Self-pay | Admitting: Family Medicine

## 2024-09-10 ENCOUNTER — Other Ambulatory Visit: Payer: Self-pay | Admitting: Family Medicine

## 2024-09-23 ENCOUNTER — Other Ambulatory Visit: Payer: Self-pay | Admitting: Family Medicine

## 2024-10-05 ENCOUNTER — Other Ambulatory Visit: Payer: Self-pay | Admitting: Family Medicine

## 2024-10-09 ENCOUNTER — Other Ambulatory Visit: Payer: Self-pay | Admitting: Family Medicine

## 2024-10-15 ENCOUNTER — Other Ambulatory Visit: Payer: Self-pay | Admitting: Family Medicine

## 2024-11-05 ENCOUNTER — Other Ambulatory Visit: Payer: Self-pay | Admitting: Family Medicine
